# Patient Record
Sex: Male | Born: 1965 | Race: White | Hispanic: No | Marital: Married | Smoking: Never smoker
Health system: Southern US, Community
[De-identification: ages and names within clinical notes are randomized; demographics above are authoritative.]

## PROBLEM LIST (undated history)

## (undated) DIAGNOSIS — R51 Headache: Secondary | ICD-10-CM

## (undated) DIAGNOSIS — K219 Gastro-esophageal reflux disease without esophagitis: Secondary | ICD-10-CM

## (undated) DIAGNOSIS — M1990 Unspecified osteoarthritis, unspecified site: Secondary | ICD-10-CM

## (undated) DIAGNOSIS — G4730 Sleep apnea, unspecified: Secondary | ICD-10-CM

## (undated) DIAGNOSIS — R0602 Shortness of breath: Secondary | ICD-10-CM

## (undated) DIAGNOSIS — I10 Essential (primary) hypertension: Secondary | ICD-10-CM

## (undated) DIAGNOSIS — Z87442 Personal history of urinary calculi: Secondary | ICD-10-CM

## (undated) DIAGNOSIS — J189 Pneumonia, unspecified organism: Secondary | ICD-10-CM

## (undated) DIAGNOSIS — Z8489 Family history of other specified conditions: Secondary | ICD-10-CM

## (undated) DIAGNOSIS — E119 Type 2 diabetes mellitus without complications: Secondary | ICD-10-CM

## (undated) HISTORY — PX: PATELLAR TENDON REPAIR: SHX737

## (undated) HISTORY — PX: CARPAL TUNNEL RELEASE: SHX101

## (undated) HISTORY — PX: CORNEA LACERATION REPAIR: SHX355

## (undated) HISTORY — PX: HERNIA REPAIR: SHX51

## (undated) HISTORY — PX: TONSILLECTOMY: SUR1361

## (undated) HISTORY — PX: COLONOSCOPY WITH ESOPHAGOGASTRODUODENOSCOPY (EGD): SHX5779

## (undated) HISTORY — PX: OTHER SURGICAL HISTORY: SHX169

---

## 2014-01-15 ENCOUNTER — Other Ambulatory Visit: Payer: Self-pay | Admitting: Physical Medicine and Rehabilitation

## 2014-01-15 DIAGNOSIS — M5136 Other intervertebral disc degeneration, lumbar region: Secondary | ICD-10-CM

## 2014-01-22 ENCOUNTER — Ambulatory Visit
Admission: RE | Admit: 2014-01-22 | Discharge: 2014-01-22 | Disposition: A | Discharge status: Home / Self Care | Payer: Managed Care, Other (non HMO) | Source: Ambulatory Visit | Attending: Physical Medicine and Rehabilitation | Admitting: Physical Medicine and Rehabilitation

## 2014-01-22 DIAGNOSIS — M5136 Other intervertebral disc degeneration, lumbar region: Secondary | ICD-10-CM

## 2014-04-20 ENCOUNTER — Other Ambulatory Visit: Payer: Self-pay

## 2014-05-11 ENCOUNTER — Encounter (HOSPITAL_COMMUNITY): Payer: Self-pay | Admitting: Pharmacy Technician

## 2014-05-12 NOTE — Pre-Procedure Instructions (Signed)
Cory Holland  05/12/2014   Your procedure is scheduled on:  Wed, June 3 @ 8:30 AM  Report to [facility]  at 6:30 AM.  Call this number if you have problems the morning of surgery: [phone]   Remember:   Do not eat food or drink liquids after midnight.   Take these medicines the morning of surgery with A SIP OF WATER: Omeprazole(Prilosec) and Zoloft(Sertraline)              Stop taking your Ibuprofen,Meloxicam,and Naproxen. No Goody's,BC's,Aspirin,Fish Oil,or any Herbal Medications   Do not wear jewelry  Do not wear lotions, powders, or colognes. You may wear deodorant.  Men may shave face and neck.  Do not bring valuables to the hospital.  [facility] is not responsible                  for any belongings or valuables.               Contacts, dentures or bridgework may not be worn into surgery.  Leave suitcase in the car. After surgery it may be brought to your room.  For patients admitted to the hospital, discharge time is determined by your                treatment team.                   Special Instructions:  [name] - Preparing for Surgery  Before surgery, you can play an important role.  Because skin is not sterile, your skin needs to be as free of germs as possible.  You can reduce the number of germs on you skin by washing with CHG (chlorahexidine gluconate) soap before surgery.  CHG is an antiseptic cleaner which kills germs and bonds with the skin to continue killing germs even after washing.  Please DO NOT use if you have an allergy to CHG or antibacterial soaps.  If your skin becomes reddened/irritated stop using the CHG and inform your nurse when you arrive at Short Stay.  Do not shave (including legs and underarms) for at least 48 hours prior to the first CHG shower.  You may shave your face.  Please follow these instructions carefully:   1.  Shower with CHG Soap the night before surgery and the                                morning of  Surgery.  2.  If you choose to wash your hair, wash your hair first as usual with your       normal shampoo.  3.  After you shampoo, rinse your hair and body thoroughly to remove the                      Shampoo.  4.  Use CHG as you would any other liquid soap.  You can apply chg directly       to the skin and wash gently with scrungie or a clean washcloth.  5.  Apply the CHG Soap to your body ONLY FROM THE NECK DOWN.        Do not use on open wounds or open sores.  Avoid contact with your eyes,       ears, mouth and genitals (private parts).  Wash genitals (private parts)       with your normal soap.  6.  Wash thoroughly, paying special attention to the area where your surgery        will be performed.  7.  Thoroughly rinse your body with warm water from the neck down.  8.  DO NOT shower/wash with your normal soap after using and rinsing off       the CHG Soap.  9.  Pat yourself dry with a clean towel.            10.  Wear clean pajamas.            11.  Place clean sheets on your bed the night of your first shower and do not        sleep with pets.  Day of Surgery  Do not apply any lotions/deoderants the morning of surgery.  Please wear clean clothes to the hospital/surgery center.     Please read over the following fact sheets that you were given: Pain Booklet, Coughing and Deep Breathing, Blood Transfusion Information, MRSA Information and Surgical Site Infection Prevention

## 2014-05-13 ENCOUNTER — Encounter (HOSPITAL_COMMUNITY)
Admission: RE | Admit: 2014-05-13 | Discharge: 2014-05-13 | Disposition: A | Discharge status: Home / Self Care | Payer: Managed Care, Other (non HMO) | Source: Ambulatory Visit | Attending: Orthopedic Surgery | Admitting: Orthopedic Surgery

## 2014-05-13 ENCOUNTER — Encounter (HOSPITAL_COMMUNITY): Payer: Self-pay

## 2014-05-13 DIAGNOSIS — M5137 Other intervertebral disc degeneration, lumbosacral region: Secondary | ICD-10-CM | POA: Insufficient documentation

## 2014-05-13 DIAGNOSIS — Z01818 Encounter for other preprocedural examination: Secondary | ICD-10-CM | POA: Insufficient documentation

## 2014-05-13 DIAGNOSIS — M51379 Other intervertebral disc degeneration, lumbosacral region without mention of lumbar back pain or lower extremity pain: Secondary | ICD-10-CM | POA: Insufficient documentation

## 2014-05-13 DIAGNOSIS — Z01812 Encounter for preprocedural laboratory examination: Secondary | ICD-10-CM | POA: Insufficient documentation

## 2014-05-13 HISTORY — DX: Essential (primary) hypertension: I10

## 2014-05-13 HISTORY — DX: Shortness of breath: R06.02

## 2014-05-13 HISTORY — DX: Gastro-esophageal reflux disease without esophagitis: K21.9

## 2014-05-13 HISTORY — DX: Headache: R51

## 2014-05-13 LAB — CBC
HCT: 39 % (ref 39.0–52.0)
Hemoglobin: 13.4 g/dL (ref 13.0–17.0)
MCH: 29.8 pg (ref 26.0–34.0)
MCHC: 34.4 g/dL (ref 30.0–36.0)
MCV: 86.7 fL (ref 78.0–100.0)
PLATELETS: 194 10*3/uL (ref 150–400)
RBC: 4.5 MIL/uL (ref 4.22–5.81)
RDW: 13.8 % (ref 11.5–15.5)
WBC: 8.1 10*3/uL (ref 4.0–10.5)

## 2014-05-13 LAB — BASIC METABOLIC PANEL
BUN: 27 mg/dL — ABNORMAL HIGH (ref 6–23)
CHLORIDE: 100 meq/L (ref 96–112)
CO2: 24 mEq/L (ref 19–32)
Calcium: 9.9 mg/dL (ref 8.4–10.5)
Creatinine, Ser: 1.22 mg/dL (ref 0.50–1.35)
GFR calc Af Amer: 81 mL/min — ABNORMAL LOW (ref >90)
GFR calc non Af Amer: 70 mL/min — ABNORMAL LOW (ref >90)
GLUCOSE: 124 mg/dL — AB (ref 70–99)
POTASSIUM: 4 meq/L (ref 3.7–5.3)
Sodium: 138 mEq/L (ref 137–147)

## 2014-05-13 LAB — TYPE AND SCREEN
ABO/RH(D): A POS
ANTIBODY SCREEN: NEGATIVE

## 2014-05-13 LAB — ABO/RH: ABO/RH(D): A POS

## 2014-05-13 LAB — SURGICAL PCR SCREEN
MRSA, PCR: NEGATIVE
Staphylococcus aureus: NEGATIVE

## 2014-05-13 NOTE — Progress Notes (Signed)
Requested EKG and CXR from [facility].

## 2014-05-18 MED ORDER — DEXAMETHASONE SODIUM PHOSPHATE 4 MG/ML IJ SOLN
8.0000 mg | Freq: Once | INTRAMUSCULAR | Status: AC
  Administered 2014-05-19: 10 mg via INTRAVENOUS
  Filled 2014-05-18: qty 2

## 2014-05-18 MED ORDER — CHLORHEXIDINE GLUCONATE 4 % EX LIQD
60.0000 mL | Freq: Once | CUTANEOUS | Status: DC
  Filled 2014-05-18: qty 60

## 2014-05-18 MED ORDER — ACETAMINOPHEN 10 MG/ML IV SOLN
1000.0000 mg | Freq: Four times a day (QID) | INTRAVENOUS | Status: DC
  Administered 2014-05-19: 1000 mg via INTRAVENOUS

## 2014-05-18 MED ORDER — CEFAZOLIN SODIUM-DEXTROSE 2-3 GM-% IV SOLR
2.0000 g | INTRAVENOUS | Status: AC
  Administered 2014-05-19: 2 g via INTRAVENOUS
  Filled 2014-05-18: qty 50

## 2014-05-19 ENCOUNTER — Encounter (HOSPITAL_COMMUNITY)
Admission: RE | Disposition: A | Discharge status: Home / Self Care | Payer: Self-pay | Source: Ambulatory Visit | Attending: Orthopedic Surgery

## 2014-05-19 ENCOUNTER — Ambulatory Visit (HOSPITAL_COMMUNITY): Payer: Managed Care, Other (non HMO) | Admitting: Anesthesiology

## 2014-05-19 ENCOUNTER — Inpatient Hospital Stay (HOSPITAL_COMMUNITY)
Admission: RE | Admit: 2014-05-19 | Discharge: 2014-05-21 | DRG: 518 | Disposition: A | Discharge status: Home / Self Care | Payer: Managed Care, Other (non HMO) | Source: Ambulatory Visit | Attending: Orthopedic Surgery | Admitting: Orthopedic Surgery

## 2014-05-19 ENCOUNTER — Ambulatory Visit (HOSPITAL_COMMUNITY): Payer: Managed Care, Other (non HMO)

## 2014-05-19 ENCOUNTER — Encounter (HOSPITAL_COMMUNITY): Payer: Self-pay | Admitting: Surgery

## 2014-05-19 ENCOUNTER — Encounter (HOSPITAL_COMMUNITY): Payer: Managed Care, Other (non HMO) | Admitting: Anesthesiology

## 2014-05-19 DIAGNOSIS — M51379 Other intervertebral disc degeneration, lumbosacral region without mention of lumbar back pain or lower extremity pain: Principal | ICD-10-CM | POA: Diagnosis present

## 2014-05-19 DIAGNOSIS — I10 Essential (primary) hypertension: Secondary | ICD-10-CM | POA: Diagnosis present

## 2014-05-19 DIAGNOSIS — M5137 Other intervertebral disc degeneration, lumbosacral region: Secondary | ICD-10-CM

## 2014-05-19 DIAGNOSIS — Z981 Arthrodesis status: Secondary | ICD-10-CM

## 2014-05-19 DIAGNOSIS — K219 Gastro-esophageal reflux disease without esophagitis: Secondary | ICD-10-CM | POA: Diagnosis present

## 2014-05-19 DIAGNOSIS — Z79899 Other long term (current) drug therapy: Secondary | ICD-10-CM

## 2014-05-19 DIAGNOSIS — M549 Dorsalgia, unspecified: Secondary | ICD-10-CM | POA: Diagnosis present

## 2014-05-19 HISTORY — PX: ABDOMINAL EXPOSURE: SHX5708

## 2014-05-19 HISTORY — PX: LUMBAR DISC ARTHROPLASTY: SHX699

## 2014-05-19 SURGERY — LUMBAR ANTERIOR DISC ARTHROPLASTY
Anesthesia: General | Site: Spine Lumbar

## 2014-05-19 MED ORDER — PROPOFOL 10 MG/ML IV BOLUS
INTRAVENOUS | Status: AC
  Filled 2014-05-19: qty 20

## 2014-05-19 MED ORDER — LIDOCAINE HCL (CARDIAC) 20 MG/ML IV SOLN
INTRAVENOUS | Status: AC
  Filled 2014-05-19: qty 5

## 2014-05-19 MED ORDER — LACTATED RINGERS IV SOLN
INTRAVENOUS | Status: DC | PRN
  Administered 2014-05-19: 08:00:00 via INTRAVENOUS

## 2014-05-19 MED ORDER — VECURONIUM BROMIDE 10 MG IV SOLR
INTRAVENOUS | Status: AC
  Filled 2014-05-19: qty 10

## 2014-05-19 MED ORDER — FENTANYL CITRATE 0.05 MG/ML IJ SOLN
INTRAMUSCULAR | Status: AC
  Filled 2014-05-19: qty 5

## 2014-05-19 MED ORDER — FLEET ENEMA 7-19 GM/118ML RE ENEM: 1.0000 | ENEMA | Freq: Once | RECTAL | Status: AC | PRN

## 2014-05-19 MED ORDER — HEMOSTATIC AGENTS (NO CHARGE) OPTIME
TOPICAL | Status: DC | PRN
  Administered 2014-05-19: 1 via TOPICAL

## 2014-05-19 MED ORDER — MIDAZOLAM HCL 2 MG/2ML IJ SOLN
INTRAMUSCULAR | Status: AC
  Filled 2014-05-19: qty 2

## 2014-05-19 MED ORDER — NEOSTIGMINE METHYLSULFATE 10 MG/10ML IV SOLN
INTRAVENOUS | Status: DC | PRN
  Administered 2014-05-19: 4 mg via INTRAVENOUS

## 2014-05-19 MED ORDER — PHENOL 1.4 % MT LIQD
1.0000 | OROMUCOSAL | Status: DC | PRN
Start: 2014-05-19 — End: 2014-05-21

## 2014-05-19 MED ORDER — ONDANSETRON HCL 4 MG/2ML IJ SOLN
INTRAMUSCULAR | Status: DC | PRN
  Administered 2014-05-19 (×2): 4 mg via INTRAVENOUS

## 2014-05-19 MED ORDER — SERTRALINE HCL 100 MG PO TABS
100.0000 mg | ORAL_TABLET | Freq: Every day | ORAL | Status: DC
  Administered 2014-05-19 – 2014-05-20 (×2): 100 mg via ORAL
  Filled 2014-05-19 (×3): qty 1

## 2014-05-19 MED ORDER — ATORVASTATIN CALCIUM 40 MG PO TABS
40.0000 mg | ORAL_TABLET | Freq: Every day | ORAL | Status: DC
  Administered 2014-05-19 – 2014-05-20 (×2): 40 mg via ORAL
  Filled 2014-05-19 (×3): qty 1

## 2014-05-19 MED ORDER — ROCURONIUM BROMIDE 50 MG/5ML IV SOLN
INTRAVENOUS | Status: AC
  Filled 2014-05-19: qty 1

## 2014-05-19 MED ORDER — LACTATED RINGERS IV SOLN
INTRAVENOUS | Status: DC | PRN
  Administered 2014-05-19 (×2): via INTRAVENOUS

## 2014-05-19 MED ORDER — DIPHENHYDRAMINE HCL 50 MG/ML IJ SOLN: 12.5000 mg | Freq: Four times a day (QID) | INTRAMUSCULAR | Status: DC | PRN

## 2014-05-19 MED ORDER — OXYCODONE HCL 5 MG PO TABS
10.0000 mg | ORAL_TABLET | ORAL | Status: DC | PRN
  Administered 2014-05-20 – 2014-05-21 (×5): 10 mg via ORAL
  Filled 2014-05-19 (×5): qty 2

## 2014-05-19 MED ORDER — SODIUM CHLORIDE 0.9 % IV SOLN: 250.0000 mL | INTRAVENOUS | Status: DC

## 2014-05-19 MED ORDER — EPHEDRINE SULFATE 50 MG/ML IJ SOLN
INTRAMUSCULAR | Status: AC
  Filled 2014-05-19: qty 1

## 2014-05-19 MED ORDER — OXYCODONE HCL 5 MG/5ML PO SOLN: 5.0000 mg | Freq: Once | ORAL | Status: DC | PRN

## 2014-05-19 MED ORDER — GLYCOPYRROLATE 0.2 MG/ML IJ SOLN
INTRAMUSCULAR | Status: DC | PRN
  Administered 2014-05-19: 0.6 mg via INTRAVENOUS
  Administered 2014-05-19: 0.4 mg via INTRAVENOUS

## 2014-05-19 MED ORDER — ONDANSETRON HCL 4 MG/2ML IJ SOLN: 4.0000 mg | Freq: Four times a day (QID) | INTRAMUSCULAR | Status: DC | PRN

## 2014-05-19 MED ORDER — PROPOFOL 10 MG/ML IV BOLUS
INTRAVENOUS | Status: DC | PRN
  Administered 2014-05-19: 200 mg via INTRAVENOUS

## 2014-05-19 MED ORDER — LACTATED RINGERS IV SOLN
INTRAVENOUS | Status: DC
  Administered 2014-05-19 – 2014-05-20 (×2): via INTRAVENOUS

## 2014-05-19 MED ORDER — ACETAMINOPHEN 10 MG/ML IV SOLN
INTRAVENOUS | Status: AC
  Filled 2014-05-19: qty 100

## 2014-05-19 MED ORDER — DIPHENHYDRAMINE HCL 12.5 MG/5ML PO ELIX: 12.5000 mg | ORAL_SOLUTION | Freq: Four times a day (QID) | ORAL | Status: DC | PRN

## 2014-05-19 MED ORDER — SODIUM CHLORIDE 0.9 % IJ SOLN: 9.0000 mL | INTRAMUSCULAR | Status: DC | PRN

## 2014-05-19 MED ORDER — ONDANSETRON HCL 4 MG/2ML IJ SOLN
INTRAMUSCULAR | Status: AC
  Filled 2014-05-19: qty 2

## 2014-05-19 MED ORDER — DEXAMETHASONE SODIUM PHOSPHATE 10 MG/ML IJ SOLN: INTRAMUSCULAR | Status: DC | PRN

## 2014-05-19 MED ORDER — VECURONIUM BROMIDE 10 MG IV SOLR
INTRAVENOUS | Status: DC | PRN
  Administered 2014-05-19 (×2): 2 mg via INTRAVENOUS
  Administered 2014-05-19 (×2): 3 mg via INTRAVENOUS

## 2014-05-19 MED ORDER — ROCURONIUM BROMIDE 100 MG/10ML IV SOLN
INTRAVENOUS | Status: DC | PRN
  Administered 2014-05-19: 50 mg via INTRAVENOUS
  Administered 2014-05-19: 10 mg via INTRAVENOUS
  Administered 2014-05-19 (×2): 20 mg via INTRAVENOUS

## 2014-05-19 MED ORDER — DEXAMETHASONE SODIUM PHOSPHATE 4 MG/ML IJ SOLN
4.0000 mg | Freq: Four times a day (QID) | INTRAMUSCULAR | Status: DC
  Administered 2014-05-19 – 2014-05-21 (×4): 4 mg via INTRAVENOUS
  Filled 2014-05-19 (×12): qty 1

## 2014-05-19 MED ORDER — EPHEDRINE SULFATE 50 MG/ML IJ SOLN
INTRAMUSCULAR | Status: DC | PRN
  Administered 2014-05-19: 5 mg via INTRAVENOUS
  Administered 2014-05-19: 10 mg via INTRAVENOUS

## 2014-05-19 MED ORDER — BUPIVACAINE-EPINEPHRINE (PF) 0.25% -1:200000 IJ SOLN
INTRAMUSCULAR | Status: AC
  Filled 2014-05-19: qty 30

## 2014-05-19 MED ORDER — PHENYLEPHRINE HCL 10 MG/ML IJ SOLN
INTRAMUSCULAR | Status: DC | PRN
  Administered 2014-05-19: 120 ug via INTRAVENOUS
  Administered 2014-05-19: 80 ug via INTRAVENOUS
  Administered 2014-05-19: 120 ug via INTRAVENOUS
  Administered 2014-05-19: 80 ug via INTRAVENOUS

## 2014-05-19 MED ORDER — HYDROMORPHONE HCL PF 1 MG/ML IJ SOLN
INTRAMUSCULAR | Status: AC
  Filled 2014-05-19: qty 2

## 2014-05-19 MED ORDER — SODIUM CHLORIDE 0.9 % IJ SOLN: 3.0000 mL | INTRAMUSCULAR | Status: DC | PRN

## 2014-05-19 MED ORDER — THROMBIN 20000 UNITS EX SOLR
CUTANEOUS | Status: AC
  Filled 2014-05-19: qty 20000

## 2014-05-19 MED ORDER — ARTIFICIAL TEARS OP OINT
TOPICAL_OINTMENT | OPHTHALMIC | Status: AC
  Filled 2014-05-19: qty 3.5

## 2014-05-19 MED ORDER — HYDROMORPHONE HCL PF 1 MG/ML IJ SOLN: 0.2500 mg | INTRAMUSCULAR | Status: DC | PRN

## 2014-05-19 MED ORDER — BUPIVACAINE-EPINEPHRINE 0.25% -1:200000 IJ SOLN
INTRAMUSCULAR | Status: DC | PRN
  Administered 2014-05-19: 30 mL

## 2014-05-19 MED ORDER — LIDOCAINE HCL (CARDIAC) 20 MG/ML IV SOLN
INTRAVENOUS | Status: DC | PRN
  Administered 2014-05-19: 80 mg via INTRAVENOUS

## 2014-05-19 MED ORDER — DEXAMETHASONE SODIUM PHOSPHATE 10 MG/ML IJ SOLN
INTRAMUSCULAR | Status: AC
  Filled 2014-05-19: qty 1

## 2014-05-19 MED ORDER — STERILE WATER FOR INJECTION IJ SOLN
INTRAMUSCULAR | Status: AC
  Filled 2014-05-19: qty 10

## 2014-05-19 MED ORDER — GLYCOPYRROLATE 0.2 MG/ML IJ SOLN
INTRAMUSCULAR | Status: AC
  Filled 2014-05-19: qty 2

## 2014-05-19 MED ORDER — MORPHINE SULFATE (PF) 1 MG/ML IV SOLN
INTRAVENOUS | Status: AC
  Filled 2014-05-19: qty 25

## 2014-05-19 MED ORDER — SERTRALINE HCL 100 MG PO TABS
100.0000 mg | ORAL_TABLET | Freq: Every day | ORAL | Status: DC
  Filled 2014-05-19: qty 1

## 2014-05-19 MED ORDER — SODIUM CHLORIDE 0.9 % IJ SOLN
3.0000 mL | Freq: Two times a day (BID) | INTRAMUSCULAR | Status: DC
  Administered 2014-05-19 – 2014-05-20 (×2): 3 mL via INTRAVENOUS

## 2014-05-19 MED ORDER — PROMETHAZINE HCL 25 MG/ML IJ SOLN
6.2500 mg | INTRAMUSCULAR | Status: DC | PRN
Start: 2014-05-19 — End: 2014-05-19

## 2014-05-19 MED ORDER — LOSARTAN POTASSIUM 50 MG PO TABS
50.0000 mg | ORAL_TABLET | Freq: Every day | ORAL | Status: DC
  Administered 2014-05-21: 50 mg via ORAL
  Filled 2014-05-19 (×3): qty 1

## 2014-05-19 MED ORDER — DOCUSATE SODIUM 100 MG PO CAPS
100.0000 mg | ORAL_CAPSULE | Freq: Two times a day (BID) | ORAL | Status: DC
  Administered 2014-05-19 – 2014-05-21 (×4): 100 mg via ORAL
  Filled 2014-05-19 (×4): qty 1

## 2014-05-19 MED ORDER — THROMBIN 20000 UNITS EX SOLR
CUTANEOUS | Status: DC | PRN
Start: 2014-05-19 — End: 2014-05-19
  Administered 2014-05-19: 10:00:00 via TOPICAL

## 2014-05-19 MED ORDER — SODIUM CHLORIDE 0.9 % IV SOLN
10.0000 mg | INTRAVENOUS | Status: DC | PRN
  Administered 2014-05-19: 10 ug/min via INTRAVENOUS

## 2014-05-19 MED ORDER — 0.9 % SODIUM CHLORIDE (POUR BTL) OPTIME
TOPICAL | Status: DC | PRN
  Administered 2014-05-19 (×2): 1000 mL

## 2014-05-19 MED ORDER — ALBUMIN HUMAN 5 % IV SOLN
INTRAVENOUS | Status: DC | PRN
  Administered 2014-05-19: 11:00:00 via INTRAVENOUS

## 2014-05-19 MED ORDER — MORPHINE SULFATE (PF) 1 MG/ML IV SOLN
INTRAVENOUS | Status: DC
  Administered 2014-05-19: 11 mg via INTRAVENOUS
  Administered 2014-05-19: 4 mg via INTRAVENOUS
  Administered 2014-05-19 – 2014-05-20 (×2): via INTRAVENOUS
  Administered 2014-05-20 (×2): 6 mg via INTRAVENOUS
  Filled 2014-05-19: qty 25

## 2014-05-19 MED ORDER — ACETAMINOPHEN 10 MG/ML IV SOLN
1000.0000 mg | Freq: Four times a day (QID) | INTRAVENOUS | Status: AC
  Administered 2014-05-19 – 2014-05-20 (×3): 1000 mg via INTRAVENOUS
  Filled 2014-05-19 (×4): qty 100

## 2014-05-19 MED ORDER — GLYCOPYRROLATE 0.2 MG/ML IJ SOLN
INTRAMUSCULAR | Status: AC
  Filled 2014-05-19: qty 4

## 2014-05-19 MED ORDER — NALOXONE HCL 0.4 MG/ML IJ SOLN: 0.4000 mg | INTRAMUSCULAR | Status: DC | PRN

## 2014-05-19 MED ORDER — NEOSTIGMINE METHYLSULFATE 10 MG/10ML IV SOLN
INTRAVENOUS | Status: AC
  Filled 2014-05-19: qty 1

## 2014-05-19 MED ORDER — METHOCARBAMOL 500 MG PO TABS
500.0000 mg | ORAL_TABLET | Freq: Four times a day (QID) | ORAL | Status: DC | PRN
  Administered 2014-05-20 (×2): 500 mg via ORAL
  Filled 2014-05-19 (×2): qty 1

## 2014-05-19 MED ORDER — METHOCARBAMOL 1000 MG/10ML IJ SOLN
500.0000 mg | Freq: Four times a day (QID) | INTRAVENOUS | Status: DC | PRN
  Filled 2014-05-19: qty 5

## 2014-05-19 MED ORDER — MENTHOL 3 MG MT LOZG: 1.0000 | LOZENGE | OROMUCOSAL | Status: DC | PRN

## 2014-05-19 MED ORDER — CEFAZOLIN SODIUM 1-5 GM-% IV SOLN
1.0000 g | Freq: Three times a day (TID) | INTRAVENOUS | Status: AC
  Administered 2014-05-19 – 2014-05-20 (×2): 1 g via INTRAVENOUS
  Filled 2014-05-19 (×3): qty 50

## 2014-05-19 MED ORDER — OXYCODONE HCL 5 MG PO TABS: 5.0000 mg | ORAL_TABLET | Freq: Once | ORAL | Status: DC | PRN

## 2014-05-19 MED ORDER — DEXAMETHASONE 4 MG PO TABS
4.0000 mg | ORAL_TABLET | Freq: Four times a day (QID) | ORAL | Status: DC
  Administered 2014-05-20 – 2014-05-21 (×5): 4 mg via ORAL
  Filled 2014-05-19 (×12): qty 1

## 2014-05-19 MED ORDER — FENTANYL CITRATE 0.05 MG/ML IJ SOLN
INTRAMUSCULAR | Status: DC | PRN
  Administered 2014-05-19 (×2): 50 ug via INTRAVENOUS
  Administered 2014-05-19: 150 ug via INTRAVENOUS

## 2014-05-19 MED ORDER — ONDANSETRON HCL 4 MG/2ML IJ SOLN: 4.0000 mg | INTRAMUSCULAR | Status: DC | PRN

## 2014-05-19 MED ORDER — PNEUMOCOCCAL VAC POLYVALENT 25 MCG/0.5ML IJ INJ
0.5000 mL | INJECTION | INTRAMUSCULAR | Status: AC
  Administered 2014-05-21: 0.5 mL via INTRAMUSCULAR
  Filled 2014-05-19 (×2): qty 0.5

## 2014-05-19 SURGICAL SUPPLY — 90 items
APPLIER CLIP 11 MED OPEN (CLIP) ×3
BLADE 10 SAFETY STRL DISP (BLADE) IMPLANT
BLADE SURG 10 STRL SS (BLADE) ×3 IMPLANT
CLIP APPLIE 11 MED OPEN (CLIP) ×2 IMPLANT
CLIP LIGATING EXTRA MED SLVR (CLIP) IMPLANT
CLIP LIGATING EXTRA SM BLUE (MISCELLANEOUS) IMPLANT
CLSR STERI-STRIP ANTIMIC 1/2X4 (GAUZE/BANDAGES/DRESSINGS) ×3 IMPLANT
CORDS BIPOLAR (ELECTRODE) ×3 IMPLANT
COVER SURGICAL LIGHT HANDLE (MISCELLANEOUS) ×9 IMPLANT
DERMABOND ADVANCED (GAUZE/BANDAGES/DRESSINGS) ×2
DERMABOND ADVANCED .7 DNX12 (GAUZE/BANDAGES/DRESSINGS) ×4 IMPLANT
DRAPE C-ARM 42X72 X-RAY (DRAPES) ×6 IMPLANT
DRAPE C-ARMOR (DRAPES) ×3 IMPLANT
DRAPE INCISE IOBAN 66X45 STRL (DRAPES) ×3 IMPLANT
DRAPE POUCH INSTRU U-SHP 10X18 (DRAPES) ×3 IMPLANT
DRAPE PROXIMA HALF (DRAPES) ×9 IMPLANT
DRAPE SURG 17X23 STRL (DRAPES) ×12 IMPLANT
DRAPE U-SHAPE 47X51 STRL (DRAPES) ×3 IMPLANT
DRSG MEPILEX BORDER 4X8 (GAUZE/BANDAGES/DRESSINGS) ×3 IMPLANT
DURAPREP 26ML APPLICATOR (WOUND CARE) ×3 IMPLANT
ELECT BLADE 4.0 EZ CLEAN MEGAD (MISCELLANEOUS) ×6
ELECT CAUTERY BLADE 6.4 (BLADE) IMPLANT
ELECT PENCIL ROCKER SW 15FT (MISCELLANEOUS) ×3 IMPLANT
ELECT REM PT RETURN 9FT ADLT (ELECTROSURGICAL) ×3
ELECTRODE BLDE 4.0 EZ CLN MEGD (MISCELLANEOUS) ×4 IMPLANT
ELECTRODE REM PT RTRN 9FT ADLT (ELECTROSURGICAL) ×2 IMPLANT
GAUZE SPONGE 4X4 16PLY XRAY LF (GAUZE/BANDAGES/DRESSINGS) ×6 IMPLANT
GLOVE BIOGEL PI IND STRL 7.0 (GLOVE) ×2 IMPLANT
GLOVE BIOGEL PI IND STRL 8 (GLOVE) ×2 IMPLANT
GLOVE BIOGEL PI IND STRL 8.5 (GLOVE) ×2 IMPLANT
GLOVE BIOGEL PI INDICATOR 7.0 (GLOVE) ×1
GLOVE BIOGEL PI INDICATOR 8 (GLOVE) ×1
GLOVE BIOGEL PI INDICATOR 8.5 (GLOVE) ×1
GLOVE ECLIPSE 8.5 STRL (GLOVE) ×3 IMPLANT
GLOVE ORTHO TXT STRL SZ7.5 (GLOVE) ×3 IMPLANT
GLOVE SS BIOGEL STRL SZ 7.5 (GLOVE) ×2 IMPLANT
GLOVE SUPERSENSE BIOGEL SZ 7.5 (GLOVE) ×1
GLOVE SURG SS PI 7.0 STRL IVOR (GLOVE) ×3 IMPLANT
GOWN STRL REUS W/ TWL LRG LVL3 (GOWN DISPOSABLE) ×8 IMPLANT
GOWN STRL REUS W/TWL 2XL LVL3 (GOWN DISPOSABLE) ×6 IMPLANT
GOWN STRL REUS W/TWL LRG LVL3 (GOWN DISPOSABLE) ×4
HEMOSTAT SNOW SURGICEL 2X4 (HEMOSTASIS) IMPLANT
INLAY POLYETHYLENE W/TANTALUM (Orthopedic Implant) ×3 IMPLANT
INSERT FOGARTY 61MM (MISCELLANEOUS) IMPLANT
INSERT FOGARTY SM (MISCELLANEOUS) IMPLANT
KIT BASIN OR (CUSTOM PROCEDURE TRAY) ×3 IMPLANT
KIT ROOM TURNOVER OR (KITS) ×6 IMPLANT
LOOP VESSEL MAXI BLUE (MISCELLANEOUS) IMPLANT
LOOP VESSEL MINI RED (MISCELLANEOUS) IMPLANT
NEEDLE 18GX1X1/2 (RX/OR ONLY) (NEEDLE) ×3 IMPLANT
NS IRRIG 1000ML POUR BTL (IV SOLUTION) ×6 IMPLANT
PACK LAMINECTOMY ORTHO (CUSTOM PROCEDURE TRAY) ×3 IMPLANT
PACK UNIVERSAL I (CUSTOM PROCEDURE TRAY) ×3 IMPLANT
PAD ARMBOARD 7.5X6 YLW CONV (MISCELLANEOUS) ×12 IMPLANT
PLATE END SUPERIOR (Plate) ×3 IMPLANT
PLATE INFERIOR END (Plate) ×3 IMPLANT
SPONGE INTESTINAL PEANUT (DISPOSABLE) ×12 IMPLANT
SPONGE LAP 18X18 X RAY DECT (DISPOSABLE) ×3 IMPLANT
SPONGE LAP 4X18 X RAY DECT (DISPOSABLE) ×3 IMPLANT
SPONGE SURGIFOAM ABS GEL 100 (HEMOSTASIS) ×3 IMPLANT
STAPLER VISISTAT 35W (STAPLE) ×3 IMPLANT
STRIP CLOSURE SKIN 1/2X4 (GAUZE/BANDAGES/DRESSINGS) ×3 IMPLANT
SURGIFLO TRUKIT (HEMOSTASIS) ×3 IMPLANT
SUT MNCRL AB 4-0 PS2 18 (SUTURE) ×3 IMPLANT
SUT MON AB 3-0 SH 27 (SUTURE) ×1
SUT MON AB 3-0 SH27 (SUTURE) ×2 IMPLANT
SUT PDS AB 1 CTX 36 (SUTURE) ×3 IMPLANT
SUT PROLENE 4 0 RB 1 (SUTURE)
SUT PROLENE 4-0 RB1 .5 CRCL 36 (SUTURE) IMPLANT
SUT PROLENE 5 0 CC1 (SUTURE) IMPLANT
SUT PROLENE 6 0 C 1 30 (SUTURE) IMPLANT
SUT PROLENE 6 0 CC (SUTURE) IMPLANT
SUT SILK 0 TIES 10X30 (SUTURE) ×3 IMPLANT
SUT SILK 2 0 TIES 10X30 (SUTURE) ×6 IMPLANT
SUT SILK 2 0SH CR/8 30 (SUTURE) IMPLANT
SUT SILK 3 0 TIES 10X30 (SUTURE) ×3 IMPLANT
SUT SILK 3 0SH CR/8 30 (SUTURE) IMPLANT
SUT VIC AB 0 CT1 27 (SUTURE) ×3
SUT VIC AB 0 CT1 27XBRD ANBCTR (SUTURE) ×6 IMPLANT
SUT VIC AB 2-0 CT1 18 (SUTURE) ×3 IMPLANT
SUT VIC AB 2-0 CT1 36 (SUTURE) ×3 IMPLANT
SUT VIC AB 3-0 SH 27 (SUTURE) ×1
SUT VIC AB 3-0 SH 27X BRD (SUTURE) ×2 IMPLANT
SUT VIC AB 3-0 SH 27XBRD (SUTURE) IMPLANT
SYR BULB IRRIGATION 50ML (SYRINGE) ×3 IMPLANT
SYR CONTROL 10ML LL (SYRINGE) ×3 IMPLANT
TOWEL OR 17X24 6PK STRL BLUE (TOWEL DISPOSABLE) ×6 IMPLANT
TOWEL OR 17X26 10 PK STRL BLUE (TOWEL DISPOSABLE) ×6 IMPLANT
TRAY FOLEY CATH 14FRSI W/METER (CATHETERS) ×3 IMPLANT
WATER STERILE IRR 1000ML POUR (IV SOLUTION) IMPLANT

## 2014-05-19 NOTE — Anesthesia Procedure Notes (Addendum)
Procedure Name: Intubation Date/Time: 05/19/2014 8:40 AM Performed by: Sharlene Dory E Pre-anesthesia Checklist: Patient identified, Emergency Drugs available, Suction available, Patient being monitored and Timeout performed Patient Re-evaluated:Patient Re-evaluated prior to inductionOxygen Delivery Method: Circle system utilized Preoxygenation: Pre-oxygenation with 100% oxygen Intubation Type: IV induction Ventilation: Mask ventilation without difficulty and Oral airway inserted - appropriate to patient size Laryngoscope Size: Mac and 3 Grade View: Grade II Tube type: Oral Tube size: 7.5 mm Number of attempts: 1 Airway Equipment and Method: Stylet Placement Confirmation: ETT inserted through vocal cords under direct vision,  positive ETCO2 and breath sounds checked- equal and bilateral Secured at: 22 cm Tube secured with: Tape Dental Injury: Teeth and Oropharynx as per pre-operative assessment  Comments: AOI per Harlen [employer], SRNA.

## 2014-05-19 NOTE — Brief Op Note (Signed)
05/19/2014  12:08 PM  PATIENT:  Cory Holland  48 y.o. male  PRE-OPERATIVE DIAGNOSIS:  DEGENERATIVE DISC DISEASE  L5-S1   POST-OPERATIVE DIAGNOSIS:  DEGENERATIVE DISC DISEASE  L5-S1   PROCEDURE:  Procedure(s): TOTAL DISC REPLACEMENT L5-S1  (1 LEVEL) (N/A) ABDOMINAL EXPOSURE (N/A)  SURGEON:  Surgeon(s) and Role: Panel 1:    * Venita Lick, MD - Primary  Panel 2:    * Larina Earthly, MD - Primary  PHYSICIAN ASSISTANT:   ASSISTANTS: Zonia Kief  Vascular Surgeon: Dr Arbie Cookey   ANESTHESIA:   general  EBL:  Total I/O In: 2050 [I.V.:1800; IV Piggyback:250] Out: 725 [Urine:325; Blood:400]  BLOOD ADMINISTERED:none  DRAINS: none   LOCAL MEDICATIONS USED:  NONE  SPECIMEN:  No Specimen  DISPOSITION OF SPECIMEN:  N/A  COUNTS:  YES  TOURNIQUET:  * No tourniquets in log *  DICTATION: .Other Dictation: Dictation Number [id number]  PLAN OF CARE: Admit to inpatient   PATIENT DISPOSITION:  PACU - hemodynamically stable.

## 2014-05-19 NOTE — Anesthesia Preprocedure Evaluation (Addendum)
Anesthesia Evaluation  Patient identified by MRN, date of birth, ID band Patient awake    Reviewed: Allergy & Precautions, H&P , NPO status , Patient's Chart, lab work & pertinent test results  History of Anesthesia Complications Negative for: history of anesthetic complications  Airway Mallampati: II TM Distance: >3 FB Neck ROM: Full    Dental  (+) Teeth Intact, Dental Advisory Given   Pulmonary neg pulmonary ROS,    Pulmonary exam normal       Cardiovascular hypertension, Pt. on medications     Neuro/Psych negative neurological ROS  negative psych ROS   GI/Hepatic Neg liver ROS, GERD-  Medicated,  Endo/Other  negative endocrine ROS  Renal/GU negative Renal ROS     Musculoskeletal   Abdominal   Peds  Hematology   Anesthesia Other Findings   Reproductive/Obstetrics                          Anesthesia Physical Anesthesia Plan  ASA: II  Anesthesia Plan: General   Post-op Pain Management:    Induction: Intravenous  Airway Management Planned: Oral ETT  Additional Equipment:   Intra-op Plan:   Post-operative Plan: Extubation in OR  Informed Consent: I have reviewed the patients History and Physical, chart, labs and discussed the procedure including the risks, benefits and alternatives for the proposed anesthesia with the patient or authorized representative who has indicated his/her understanding and acceptance.   Dental advisory given  Plan Discussed with: CRNA, Anesthesiologist and Surgeon  Anesthesia Plan Comments:        Anesthesia Quick Evaluation

## 2014-05-19 NOTE — Anesthesia Postprocedure Evaluation (Signed)
Anesthesia Post Note  Patient: Cory Holland  Procedure(s) Performed: Procedure(s) (LRB): TOTAL DISC REPLACEMENT L5-S1  (1 LEVEL) (N/A) ABDOMINAL EXPOSURE (N/A)  Anesthesia type: general  Patient location: PACU  Post pain: Pain level controlled  Post assessment: Patient's Cardiovascular Status Stable  Last Vitals:  Filed Vitals:   05/19/14 1245  BP: 107/57  Pulse: 115  Temp:   Resp: 14    Post vital signs: Reviewed and stable  Level of consciousness: sedated  Complications: No apparent anesthesia complications

## 2014-05-19 NOTE — Progress Notes (Signed)
Utilization review completed.  

## 2014-05-19 NOTE — Transfer of Care (Signed)
Immediate Anesthesia Transfer of Care Note  Patient: Cory Holland  Procedure(s) Performed: Procedure(s): TOTAL DISC REPLACEMENT L5-S1  (1 LEVEL) (N/A) ABDOMINAL EXPOSURE (N/A)  Patient Location: PACU  Anesthesia Type:General  Level of Consciousness: sedated  Airway & Oxygen Therapy: Patient Spontanous Breathing and Patient connected to face mask oxygen  Post-op Assessment: Report given to PACU RN, Post -op Vital signs reviewed and stable and Patient moving all extremities X 4  Post vital signs: Reviewed and stable  Complications: No apparent anesthesia complications

## 2014-05-19 NOTE — Op Note (Signed)
    OPERATIVE REPORT  DATE OF SURGERY: 05/19/2014  PATIENT: Cory Holland, 48 y.o. male MRN: [id number]  DOB: [birth year]  PRE-OPERATIVE DIAGNOSIS: Degenerative disc disease  POST-OPERATIVE DIAGNOSIS:  Same  PROCEDURE: Anterior exposure for L5-S1 disc replacement  SURGEON:  Gretta Began, M.D.  Co-surgeon for the exposure Dr. Shon Baton  ANESTHESIA:  Gen.    Total I/O In: 2550 [I.V.:2300; IV Piggyback:250] Out: 800 [Urine:400; Blood:400]  BLOOD ADMINISTERED: None  DRAINS: None  SPECIMEN: L5-S1 disc  COUNTS CORRECT:  YES  PLAN OF CARE: Recovery room   PATIENT DISPOSITION:  PACU - hemodynamically stable  PROCEDURE DETAILS: Patient was taken up replacing that is where the area of the abdomen was prepped and draped in usual fashion. Lateral C-arm projection was used to identify the level of the L5-S1 disc. This was in the left lower quadrant. This was marked. Incision was made in the left lower quadrant carried down through the subcutaneous fat with electrocautery. That was mobilized off the anterior rectus sheath. The anterior rectus sheath was opened in line with the skin incision. The rectus muscle was mobilized circumferentially. The retroperitoneal space was entered bluntly below the level of the semilunar line and the peritoneal contents were mobilized to the right. The posterior rectus sheath was opened laterally. Mobilization continued to the level of L5-S1 disc. The peritoneum was not entered. The ureter was identified and preserved and mobilized to the right as well. The L5-S1 disc was exposed between the level of the common iliac arteries and veins. This was bluntly mobilized. The middle sacral vessels were occluded with ligaclips and divided. The mobilization continued to the right and left of the disc. The Thompson retractor was brought onto the field and the reverse lip 150 blades were positioned to the right and left of the L5-S1 disc and the malleable blades were placed  superiorly and inferiorly. C-arm was brought back onto the field to confirm this location. The remainder of the procedure including closure of the dictated as a separate note by Dr. Serina Cowper, M.D. 05/19/2014 1:32 PM

## 2014-05-19 NOTE — Progress Notes (Signed)
CSW to follow and assist with discharge planning as needed.  Sabino Niemann, MSW, [employer] [phone]

## 2014-05-19 NOTE — Evaluation (Signed)
Occupational Therapy Evaluation Patient Details Name: Cory Holland MRN: [id number] DOB: September 30, 1966 Today's Date: 05/19/2014    History of Present Illness 48 y.o. s/p TOTAL DISC REPLACEMENT L5-S1  (1 LEVEL)    Clinical Impression   Pt presents with below problem list. Pt independent with ADLs, PTA. Feel pt will benefit from acute OT to increase independence prior to d/c.    Follow Up Recommendations  No OT follow up;Supervision - Intermittent    Equipment Recommendations  3 in 1 bedside comode    Recommendations for Other Services       Precautions / Restrictions Precautions Precautions: Back Precaution Booklet Issued: Yes (comment) Precaution Comments: Reviewed precautions with pt/family Required Braces or Orthoses: Spinal Brace Spinal Brace: Lumbar corset;Applied in sitting position Restrictions Weight Bearing Restrictions: No      Mobility Bed Mobility Overal bed mobility: Needs Assistance Bed Mobility: Rolling;Sidelying to Sit Rolling: Min guard Sidelying to sit: Mod assist       General bed mobility comments: Assistance with trunk. Cues for log roll technique.  Transfers Overall transfer level: Needs assistance   Transfers: Sit to/from Stand;Stand Pivot Transfers Sit to Stand: Mod assist Stand pivot transfers: Min assist       General transfer comment: Cues for technique. Assistance to rise to standing position.    Balance                                            ADL Overall ADL's : Needs assistance/impaired     Grooming: Wash/dry face;Set up;Sitting;Supervision/safety           Upper Body Dressing : Minimal assistance;Sitting (back brace)   Lower Body Dressing: Maximal assistance;Sit to/from stand   Toilet Transfer: Minimal assistance;Ambulation;Stand-pivot;RW (from bed to chair)           Functional mobility during ADLs: Minimal assistance;Rolling walker General ADL Comments: Educated that AE is available for ADLs  and toilet aid if needed. Educated on use of two cups for teeth care and placement of grooming items to avoid breaking precautions. Educated on back brace. Educated on safe shoewear, discussed rugs, and recommended sitting for LB bathing/dressing.     Vision                     Perception     Praxis      Pertinent Vitals/Pain Pain 4/10. Repositioned.      Hand Dominance Right   Extremity/Trunk Assessment Upper Extremity Assessment Upper Extremity Assessment: Overall WFL for tasks assessed   Lower Extremity Assessment Lower Extremity Assessment: Defer to PT evaluation       Communication Communication Communication: No difficulties   Cognition Arousal/Alertness: Awake/alert Behavior During Therapy: WFL for tasks assessed/performed Overall Cognitive Status: Within Functional Limits for tasks assessed                     General Comments       Exercises       Shoulder Instructions      Home Living Family/patient expects to be discharged to:: Private residence Living Arrangements: Spouse/significant other Available Help at Discharge: Family;Available 24 hours/day Type of Home: House Home Access: Stairs to enter [employer] of Steps: 5 Entrance Stairs-Rails: Left       Bathroom Shower/Tub: Chief Strategy Officer: Standard     Home Equipment: Hand held shower head;Tub  bench;Walker - standard          Prior Functioning/Environment Level of Independence: Independent             OT Diagnosis: Acute pain   OT Problem List: Decreased strength;Decreased activity tolerance;Pain;Decreased knowledge of precautions;Decreased knowledge of use of DME or AE   OT Treatment/Interventions: Self-care/ADL training;DME and/or AE instruction;Therapeutic activities;Patient/family education;Balance training    OT Goals(Current goals can be found in the care plan section) Acute Rehab OT Goals Patient Stated Goal: not stated OT  Goal Formulation: With patient Time For Goal Achievement: 05/26/14 Potential to Achieve Goals: Good ADL Goals Pt Will Perform Grooming: with modified independence;standing Pt Will Perform Lower Body Bathing: with modified independence;sit to/from stand Pt Will Perform Lower Body Dressing: with modified independence;sit to/from stand Pt Will Transfer to Toilet: with modified independence;ambulating (3 in 1 over toilet) Pt Will Perform Toileting - Clothing Manipulation and hygiene: with modified independence;sit to/from stand Pt Will Perform Tub/Shower Transfer: Tub transfer;with supervision;ambulating;tub bench;rolling walker Additional ADL Goal #1: Pt will don/doff back brace independently.  OT Frequency: Min 2X/week   Barriers to D/C:            Co-evaluation              End of Session Equipment Utilized During Treatment: Gait belt;Rolling walker;Back brace;Oxygen Nurse Communication: Other (comment) (pt up in chair; became dizzy)  Activity Tolerance: Other (comment) (dizzy) Patient left: in chair;with call bell/phone within reach;with family/visitor present   Time: [id number] OT Time Calculation (min): 32 min Charges:  OT General Charges $OT Visit: 1 Procedure OT Evaluation $Initial OT Evaluation Tier I: 1 Procedure OT Treatments $Self Care/Home Management : 8-22 mins G-Codes:    Earlie RavelingLindsey L Khole Branch OTR/L [phone] 05/19/2014, 5:52 PM

## 2014-05-19 NOTE — H&P (Signed)
History of Present Illness  The patient is a 48 year old male who comes in today for a preoperative History and Physical. The patient is scheduled for a to be performed by Dr. Debria Garret D. Shon Baton, MD at [facility] on 05/19/2014 . Please see the hospital record for complete dictated history and physical.  [age] YO white male with a history of L5-S1 DDD and chronic back pain returns. He states that symptoms are unchanged from previous visit. Pain is only in the lumbar spine and there is nothing down his legs. He is wanting to proceed with L5-S1 total disc replacement vs. ALIF as scheduled.   Allergies Benadryl Allergy *ANTIHISTAMINES*    Family History Cancer. mother Diabetes Mellitus. mother Heart Disease. father Hypertension. father Cerebrovascular Accident. Father. father    Social History Alcohol use. never consumed alcohol Children. 2 Current work status. working full time Drug/Alcohol Rehab (Currently). no Exercise. Exercises monthly; does other Illicit drug use. no Living situation. live with spouse Marital status. 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 2-3 Pain Contract. no Previously in rehab. no Tobacco / smoke exposure. no Tobacco use. Never smoker. never smoker    Medication History Atorvastatin Calcium (40MG  Tablet, Oral) Active. Losartan Potassium (50MG  Tablet, Oral) Active. Meloxicam (15MG  Tablet, Oral) Active. Omeprazole (20MG  Capsule DR, Oral) Active. Sertraline HCl (100MG  Tablet, Oral) Active. SUMAtriptan Succinate (100MG  Tablet, Oral) Active. Testosterone Cypionate (100MG /ML Solution, Intramuscular) Active. Zolpidem Tartrate (10MG  Tablet, Oral) Active. Medications Reconciled.    Vitals 05/11/2014 8:27 AM Weight: 231.31 lb Height: 67 in Body Surface Area: 2.23 m Body Mass Index: 36.23 kg/m Pulse: 92  (Regular) BP: 137/79 (Sitting, Left Arm, Standard)     Objective Transcription  On exam, he is a very pleasant white male, A&O times 3 and in no acute distress. No increase in respiratory effort.  HEAD: Normocephalic, atraumatic.  PERRLA, EOMI.  LUNGS: CTA bilaterally, no wheezing noted.  HEART: RRR, no murmurs noted.  ABDOMEN: Round, nondistended, NBS times 4. Soft and nontender.  NEUROLOGICAL: He is neurologically intact.  SKIN: Warm and dry.   Plans Transcription  His MRI was actually last done in September. Because of the long delay with dealing with the insurance approvals, his MRI is now greater than 6 months out and so I am going to get a STAT MRI for his preoperative evaluation. He has his preoperative testing this Thursday and we will plan on surgery next week, Wednesday. I did tell him unfortunately I will be out of town Friday evening for one week, but my partners will be available if there are any complications, which I do not foresee. We reviewed the risks which include infection, bleeding, nerve damage, death, stroke, paralysis, failure to heal, need for further surgery, ongoing or worse pain, retrograde ejaculation, and need for fusion instead of disc replacement. All his and his wife's questions were addressed.  No new changes on MRI.  No significant facet arthropathy noted, collapse and degenerative changes noted of the disc.

## 2014-05-20 ENCOUNTER — Inpatient Hospital Stay (HOSPITAL_COMMUNITY): Payer: Managed Care, Other (non HMO)

## 2014-05-20 DIAGNOSIS — Z09 Encounter for follow-up examination after completed treatment for conditions other than malignant neoplasm: Secondary | ICD-10-CM

## 2014-05-20 NOTE — Op Note (Signed)
Cory Holland                ACCOUNT NO.:  [id number]  MEDICAL RECORD NO.:  [id number]  LOCATION:  [id number]                        FACILITY:  [facility]  PHYSICIAN:  Alvy Beal, MD    DATE OF BIRTH:  1966-01-05  DATE OF PROCEDURE:  05/19/2014 DATE OF DISCHARGE:                              OPERATIVE REPORT   PREOPERATIVE DIAGNOSIS:  Degenerative disk disease, L5-S1.  POSTOPERATIVE DIAGNOSIS:  Degenerative disk disease, L5-S1.  OPERATIVE PROCEDURE:  Total disk arthroplasty, L5-S1 approach.  SURGEON:  Dr. Colin Benton  FIRST ASSISTANT:  Genene Churn. Denton Meek.  IMPLANT USED:  DePuy __________ size 10, 11-degree lordotic medium implant.  INDICATIONS:  Cory Holland is a very pleasant 48 year old gentleman who unfortunately has been having severe progressive debilitating back pain. He has all flexion mediated pain, no significant extension pain.  He has had attempts at conservative management consisting of prolonged physiotherapy counseling, injection therapy, activity modification, and he continues to suffer.  As a result, we elected to proceed with surgery.  All appropriate risks, benefits, and alternatives were discussed with the patient.  Consent was obtained.  OPERATIVE NOTE:  The patient was brought to the operating room, placed supine on the operating table.  After successful induction of general anesthesia and endotracheal intubation, TEDs, SCDs, and a Foley were inserted.  The abdomen was prepped and draped in a standard fashion.  Dr. Colin Benton then scrubbed in and performed a standard retroperitoneal approach to the lumbar spine.  Please refer to his dictation for specifics.  Once he had exposed the L5-S1 level and retractors were placed, the needle was placed into the disk space.  An intraoperative lateral fluoro confirmed that this was the appropriate level.  Once this was completed, I then scrubbed into the case.  An annulotomy was performed with a 10-blade scalpel, then using  a combination of pituitary rongeurs and Kerrison rongeurs, I removed the bulk of the disk material.  I then used paddle distractors to open up the collapsed disk space and continued to work posteriorly.  Using curettes and the pituitary rongeurs, I removed all of the disk space, so it was down to the posterior margin of the vertebral body.  Then, using a 2-mm Kerrison, I trimmed down the posterior osteophyte and released the annulus.  At this point, I then took a curved nerve hook and passed it along the entire posterior rim of L5 and S1 confirming that the annulus had adequately been released.  I could now freely palpate behind the vertebral body.  I then made sure I had all the cartilage endplate removed and had bleeding subchondral bone.  I then removed the larger ring in the anterior osteophytes.  Once this was done, I then placed the trial implants and malleted it to the appropriate depth.  Once this was appropriately positioned, I checked both the AP and lateral planes to confirm that it was essentially in the midline based on the spinous process and the interspinous process distance.  Once this was confirmed, I then took the osteotome and malleted over the guide to create a thin cut.  I then irrigated the wound copiously with normal saline and then obtained the appropriate size  actual prosthesis and malleted it into place.  Great care was taken to protect the iliac vessels during this portion, so there was no injury.  Once the implant was well seated and down to the same depth it was on the trial, I then inserted the poly insert.  I then checked to make sure there was no step-off and no gap and there was not, I removed all the inserting devices, irrigated the wound copiously with normal saline, and made sure I had hemostasis using bone rasp, bipolar electrocautery, and FloSeal.  I then removed all the retractors sequentially and there was no active bleeding.  I closed the fascia of  the rectus with running #1 Prolene and then a layered 2-0 and a 3-0 Monocryl for the skin.  Steri-Strips and a dry dressing were applied.  The patient was ultimately extubated and transferred to PACU without incident.  At the end of the case, all needle and sponge counts were correct.  There was no adverse intraoperative events.     Alvy Bealahari D Haili Donofrio, MD     DDB/MEDQ  D:  05/19/2014  T:  05/20/2014  Job:  [id number]

## 2014-05-20 NOTE — Care Management Note (Signed)
CARE MANAGEMENT NOTE 05/20/2014  Patient:  Cory Holland, Cory Holland   Account Number:  [id number]  Date Initiated:  05/20/2014  Documentation initiated by:  Vance Peper  Subjective/Objective Assessment:   48 yr old male s/p L5-S1 disc replacement.     Action/Plan:   Case manager spoke with patient and wife.He  has no home health needs. Will need 3in1.  Family support at discharge.   Anticipated DC Date:  05/21/2014   Anticipated DC Plan:  HOME/SELF CARE      DC Planning Services  CM consult      PAC Choice  DURABLE MEDICAL EQUIPMENT   Choice offered to / List presented to:     DME arranged  3-N-1      DME agency  [facility]     HH arranged  NA      HH agency  NA   Status of service:  Completed, signed off Medicare Important Message given?   (If response is "NO", the following Medicare IM given date fields will be blank) Date Medicare IM given:   Date Additional Medicare IM given:    Discharge Disposition:  HOME/SELF CARE  Per UR Regulation:  Reviewed for med. necessity/level of care/duration of stay  If discussed at Long Length of Stay Meetings, dates discussed:    Comments:

## 2014-05-20 NOTE — Progress Notes (Signed)
Clinical Social Worker will sign off for now as social work intervention is no longer needed. Please consult us again if new need arises.   Kataryna Mcquilkin, MSW, LCSWA [phone] 

## 2014-05-20 NOTE — Progress Notes (Signed)
*  PRELIMINARY RESULTS* Vascular Ultrasound Lower extremity venous duplex has been completed.  Preliminary findings: no evidence of DVT.  Farrel Demark, RDMS, RVT  05/20/2014, 9:56 AM

## 2014-05-20 NOTE — Progress Notes (Signed)
    Subjective: Procedure(s) (LRB): TOTAL DISC REPLACEMENT L5-S1  (1 LEVEL) (N/A) ABDOMINAL EXPOSURE (N/A) 1 Day Post-Op  Patient reports pain as 3 on 0-10 scale.  Reports decreased leg pain reports incisional back pain   Positive void Negative bowel movement Positive flatus Negative chest pain or shortness of breath  Objective: Vital signs in last 24 hours: Temp:  [97.4 F (36.3 C)-99.1 F (37.3 C)] 97.8 F (36.6 C) (06/04 1001) Pulse Rate:  [80-115] 101 (06/04 1001) Resp:  [14-24] 16 (06/04 1001) BP: (95-111)/(49-69) 105/64 mmHg (06/04 1001) SpO2:  [95 %-100 %] 95 % (06/04 1001) FiO2 (%):  [98 %] 98 % (06/04 0310)  Intake/Output from previous day: 06/03 0701 - 06/04 0700 In: 3573 [P.O.:360; I.V.:2813; IV Piggyback:400] Out: 4100 [Urine:3700; Blood:400]  Labs: No results found for this basename: WBC, RBC, HCT, PLT,  in the last 72 hours No results found for this basename: NA, K, CL, CO2, BUN, CREATININE, GLUCOSE, CALCIUM,  in the last 72 hours No results found for this basename: LABPT, INR,  in the last 72 hours  Physical Exam: Neurologically intact ABD soft Intact pulses distally Incision: dressing C/D/I Compartment soft doppler - neg.  Assessment/Plan: Patient stable  xrays satisfactory Continue mobilization with physical therapy Continue care  Advance diet Up with therapy Plan for discharge tomorrow   Venita Lick, MD [facility] [phone]

## 2014-05-20 NOTE — Progress Notes (Signed)
Occupational Therapy Treatment Patient Details Name: Cory LewandowskyDennis Holland MRN: [id number] DOB: 1966-06-22 Today's Date: 05/20/2014    History of present illness 48 y.o. s/p TOTAL DISC REPLACEMENT L5-S1  (1 LEVEL)    OT comments  Focus of today's session was on practicing don/doff of brace and AE education for LB bathing, dressing and toilet hygiene. Pt educated on and demonstrated use of AE, restated precautions and ways to modify tasks to adhere to back precautions. Pt was able to don/doff brace independently. Pt is progressing towards goals and will have assistance at home, therefore d/c plan remains appropriate.  Follow Up Recommendations  No OT follow up;Supervision - Intermittent    Equipment Recommendations  3 in 1 bedside comode       Precautions / Restrictions Precautions Precautions: Back Precaution Comments: Reviewed precautions with pt/family Required Braces or Orthoses: Spinal Brace Spinal Brace: Lumbar corset;Applied in sitting position Restrictions Weight Bearing Restrictions: No       Mobility Bed Mobility Overal bed mobility: Needs Assistance Bed Mobility: Rolling;Sidelying to Sit Rolling: Supervision Sidelying to sit: Supervision       General bed mobility comments: VC for log roll technique to prevent twisting through trunk.  No physical assist required.  Transfers Overall transfer level: Needs assistance Equipment used: None Transfers: Sit to/from Stand Sit to Stand: Modified independent (Device/Increase time)            Balance Overall balance assessment: Needs assistance Sitting-balance support: No upper extremity supported Sitting balance-Leahy Scale: Good Sitting balance - Comments: pt demonstrated don/doff of back brace without physical assistance.   Standing balance support: No upper extremity supported Standing balance-Leahy Scale: Fair Standing balance comment: Pt able to stand and walk without physical support or UE support. Supervision  provided for safety.                   ADL Overall ADL's : Needs assistance/impaired             Lower Body Bathing: Modified independent;With adaptive equipment;Adhering to back precautions;Sit to/from stand   Upper Body Dressing : Sitting;Modified independent   Lower Body Dressing: Sit to/from stand;Adhering to back precautions;With adaptive equipment;Modified independent   Toilet Transfer: Ambulation;RW;Supervision/safety;BSC StatisticianToilet Transfer Details (indicate cue type and reason): BSC over toilet  Toileting- Clothing Manipulation and Hygiene: Modified independent;Sit to/from stand;With adaptive equipment Toileting - Clothing Manipulation Details (indicate cue type and reason): using toilet aide     Functional mobility during ADLs: Rolling walker;Supervision/safety General ADL Comments: Educated on and practiced toilet aide to assist with toilet hygiene and AE for LB bathing and dressing. Pt verbally restated precautions and compensatory strategies to use when modifying ADLs.                Cognition   Behavior During Therapy: WFL for tasks assessed/performed Overall Cognitive Status: Within Functional Limits for tasks assessed                       Extremity/Trunk Assessment  Upper Extremity Assessment Upper Extremity Assessment: Defer to OT evaluation   Lower Extremity Assessment Lower Extremity Assessment: Defer to PT                    Pertinent Vitals/ Pain       No c/o pain. Pt was premedicated prior to treatment.  Home Living Family/patient expects to be discharged to:: Private residence Living Arrangements: Spouse/significant other Available Help at Discharge: Family;Available 24 hours/day Type of Home: House  Home Access: Stairs to enter Entrance Stairs-Number of Steps: 5 Entrance Stairs-Rails: Left                 Home Equipment: Hand held shower head;Tub bench;Walker - standard          Prior  Functioning/Environment Level of Independence: Independent            Frequency Min 2X/week     Progress Toward Goals  OT Goals(current goals can now be found in the care plan section)  Progress towards OT goals: Progressing toward goals  Acute Rehab OT Goals Patient Stated Goal: to go home OT Goal Formulation: With patient Time For Goal Achievement: 05/26/14 Potential to Achieve Goals: Good  Plan Discharge plan remains appropriate       End of Session Equipment Utilized During Treatment: Back brace   Activity Tolerance Patient tolerated treatment well   Patient Left in bed;with call bell/phone within reach;with family/visitor present;with nursing/sitter in room           Time: 1410-1448 OT Time Calculation (min): 38 min  Charges: OT General Charges $OT Visit: 1 Procedure OT Treatments $Self Care/Home Management : 38-52 mins  Maurene Capes 05/20/2014, 3:14 PM

## 2014-05-20 NOTE — Evaluation (Signed)
Physical Therapy Evaluation Patient Details Name: Cory LewandowskyDennis Holland MRN: [id number] DOB: 1966/11/17 Today's Date: 05/20/2014   History of Present Illness  48 y.o. s/p TOTAL DISC REPLACEMENT L5-S1  (1 LEVEL)   Clinical Impression  Pt ambulated 400 ft with RW and supervision for balance safety and navigated 5 steps with L handrail with VC to feel for next step before shifting weight.  Pt did not required physical assistance with any of therapy activities today except for slight assist to power up from sitting to standing.  Pt plans to return home with wife upon d/c.  He and his wife would like a 3-in-1 for easier toilet access.  Pt will benefit from PT services to continue to build upon gains in mobility.  Will continue to follow.    Follow Up Recommendations No PT follow up;Supervision for mobility/OOB    Equipment Recommendations  3in1 (PT)    Recommendations for Other Services       Precautions / Restrictions Precautions Precautions: Back Precaution Comments: Reviewed precautions with pt/family Required Braces or Orthoses: Spinal Brace Spinal Brace: Lumbar corset;Applied in sitting position Restrictions Weight Bearing Restrictions: No      Mobility  Bed Mobility Overal bed mobility: Needs Assistance Bed Mobility: Rolling;Sidelying to Sit Rolling: Supervision Sidelying to sit: Min guard       General bed mobility comments: VC for log roll technique to prevent twisting through trunk.  No physical assist required.  Transfers Overall transfer level: Needs assistance Equipment used: Rolling walker (2 wheeled) Transfers: Sit to/from Stand Sit to Stand: Min assist         General transfer comment: VC for hand placement, pt able to power up to standing with min-A .  Ambulation/Gait Ambulation/Gait assistance: Supervision Ambulation Distance (Feet): 400 Feet Assistive device: Rolling walker (2 wheeled);None Gait Pattern/deviations: Step-through pattern;Decreased stride  length     General Gait Details: Pt ambulated first 200 ft with RW, then last 200 ft with no AD and supervision for safety.  Pt ambulating at near his normal velocity according to wife.   Stairs Stairs: Yes Stairs assistance: Min guard Stair Management: One rail Left;Alternating pattern;Forwards Number of Stairs: 5 General stair comments: VC to feel for next step with foot before shifting weight. Pt required no physical assist.  Wheelchair Mobility    Modified Rankin (Stroke Patients Only)       Balance Overall balance assessment: Needs assistance         Standing balance support: No upper extremity supported;During functional activity Standing balance-Leahy Scale: Fair Standing balance comment: Pt able to stand and walk with no UE support without LOB. Supervision provided for safety due to decreased ability to react to potential perturbations or LOB.                             Pertinent Vitals/Pain Pt reports some pain at his incision site, does not desire pain meds at this time.    Home Living Family/patient expects to be discharged to:: Private residence Living Arrangements: Spouse/significant other Available Help at Discharge: Family;Available 24 hours/day Type of Home: House Home Access: Stairs to enter Entrance Stairs-Rails: Left Entrance Stairs-Number of Steps: 5   Home Equipment: Hand held shower head;Tub bench;Walker - standard      Prior Function Level of Independence: Independent               Hand Dominance   Dominant Hand: Right    Extremity/Trunk Assessment  Upper Extremity Assessment: Defer to OT evaluation           Lower Extremity Assessment: Overall WFL for tasks assessed         Communication   Communication: No difficulties  Cognition Arousal/Alertness: Awake/alert Behavior During Therapy: WFL for tasks assessed/performed Overall Cognitive Status: Within Functional Limits for tasks assessed                       General Comments General comments (skin integrity, edema, etc.): Pt asking for further guidance on how to approach bathroom hygeine and is interested in potentially getting tongs for assistance.    Exercises        Assessment/Plan    PT Assessment Patient needs continued PT services  PT Diagnosis Acute pain;Abnormality of gait   PT Problem List Decreased range of motion;Decreased balance;Decreased activity tolerance;Decreased mobility;Decreased knowledge of precautions;Pain  PT Treatment Interventions Stair training;Gait training;Functional mobility training;Therapeutic activities;Balance training;Patient/family education   PT Goals (Current goals can be found in the Care Plan section) Acute Rehab PT Goals Patient Stated Goal: not stated PT Goal Formulation: With patient/family Time For Goal Achievement: 05/28/14 Potential to Achieve Goals: Good    Frequency Min 5X/week   Barriers to discharge        Co-evaluation               End of Session Equipment Utilized During Treatment: Gait belt;Back brace Activity Tolerance: Patient tolerated treatment well Patient left: in chair;with call bell/phone within reach;with family/visitor present Nurse Communication: Mobility status         Time: 2952-8413 PT Time Calculation (min): 34 min   Charges:   PT Evaluation $Initial PT Evaluation Tier I: 1 Procedure PT Treatments $Gait Training: 23-37 mins   PT G Codes:          Feliciana Narayan, SPT 05/20/2014, 1:02 PM

## 2014-05-20 NOTE — Progress Notes (Signed)
Agree with note. 05/20/2014 Yaslyn Cumby, OTR/L Pager: [phone] 

## 2014-05-20 NOTE — Evaluation (Signed)
Agree with SPT.    Kaliah Haddaway, PT [phone]  

## 2014-05-21 ENCOUNTER — Encounter (HOSPITAL_COMMUNITY): Payer: Self-pay | Admitting: Orthopedic Surgery

## 2014-05-21 MED ORDER — ENOXAPARIN SODIUM 40 MG/0.4ML ~~LOC~~ SOLN
40.0000 mg | SUBCUTANEOUS | Status: DC
  Filled 2014-05-21: qty 0.4

## 2014-05-21 MED ORDER — ENOXAPARIN SODIUM 30 MG/0.3ML ~~LOC~~ SOLN: 30.0000 mg | Freq: Two times a day (BID) | SUBCUTANEOUS | Status: DC

## 2014-05-21 MED ORDER — ENOXAPARIN (LOVENOX) PATIENT EDUCATION KIT
PACK | Freq: Once | Status: DC
  Filled 2014-05-21: qty 1

## 2014-05-21 MED ORDER — MAGNESIUM CITRATE PO SOLN
0.5000 | Freq: Once | ORAL | Status: AC
  Administered 2014-05-21: 148 via ORAL
  Filled 2014-05-21: qty 296

## 2014-05-21 MED ORDER — DOCUSATE SODIUM 100 MG PO CAPS: 100.0000 mg | ORAL_CAPSULE | Freq: Two times a day (BID) | ORAL | Status: DC

## 2014-05-21 MED ORDER — OXYCODONE-ACETAMINOPHEN 10-325 MG PO TABS: 1.0000 | ORAL_TABLET | ORAL | Status: DC | PRN

## 2014-05-21 MED ORDER — FLEET ENEMA 7-19 GM/118ML RE ENEM
1.0000 | ENEMA | Freq: Once | RECTAL | Status: AC
  Administered 2014-05-21: 13:00:00 via RECTAL
  Filled 2014-05-21: qty 1

## 2014-05-21 MED ORDER — POLYETHYLENE GLYCOL 3350 17 G PO PACK: 17.0000 g | PACK | Freq: Every day | ORAL | Status: DC

## 2014-05-21 MED ORDER — ONDANSETRON 4 MG PO TBDP: 4.0000 mg | ORAL_TABLET | Freq: Three times a day (TID) | ORAL | Status: DC | PRN

## 2014-05-21 MED ORDER — METHOCARBAMOL 500 MG PO TABS: 500.0000 mg | ORAL_TABLET | Freq: Four times a day (QID) | ORAL | Status: DC | PRN

## 2014-05-21 NOTE — Progress Notes (Signed)
Agree with SPT.    Christophere Hillhouse, PT [phone]  

## 2014-05-21 NOTE — Progress Notes (Signed)
    Subjective: Procedure(s) (LRB): TOTAL DISC REPLACEMENT L5-S1  (1 LEVEL) (N/A) ABDOMINAL EXPOSURE (N/A) 2 Days Post-Op  Patient reports pain as 1 on 0-10 scale.  Reports decreased leg pain reports incisional pain   Positive void Negative bowel movement Positive flatus Negative chest pain or shortness of breath  Objective: Vital signs in last 24 hours: Temp:  [97.5 F (36.4 C)-97.9 F (36.6 C)] 97.5 F (36.4 C) (06/05 0602) Pulse Rate:  [78-103] 78 (06/05 0602) Resp:  [16] 16 (06/05 0602) BP: (105-119)/(64-73) 119/70 mmHg (06/05 0602) SpO2:  [95 %-98 %] 98 % (06/05 0602) Weight:  [230 lb 2.6 oz (104.4 kg)] 230 lb 2.6 oz (104.4 kg) (06/04 1125)  Intake/Output from previous day: 06/04 0701 - 06/05 0700 In: 1133 [P.O.:960; I.V.:173] Out: 4200 [Urine:4200]  Labs: No results found for this basename: WBC, RBC, HCT, PLT,  in the last 72 hours No results found for this basename: NA, K, CL, CO2, BUN, CREATININE, GLUCOSE, CALCIUM,  in the last 72 hours No results found for this basename: LABPT, INR,  in the last 72 hours  Physical Exam: Neurologically intact ABD soft Neurovascular intact Intact pulses distally Dorsiflexion/Plantar flexion intact Incision: dressing C/D/I Compartment soft  Assessment/Plan: Patient stable  xrays satisfactory Continue mobilization with physical therapy Continue care  Advance diet Up with therapy Plan for discharge tomorrow possible today if able to move bowels Doing very well   Venita Lick, MD [facility] [phone]

## 2014-05-21 NOTE — Progress Notes (Signed)
Pt and wife instructed on giving lovenox ing verbalized understanding

## 2014-05-21 NOTE — Progress Notes (Signed)
Physical Therapy Treatment Patient Details Name: Cory Holland MRN: [id number] DOB: Mar 08, 1966 Today's Date: 05/21/2014    History of Present Illness 48 y.o. s/p TOTAL DISC REPLACEMENT L5-S1  (1 LEVEL)     PT Comments    Pt required supervision for sidelying to sit and sit to stand transfers for reminders about hand placement.  Pt mod-I otherwise for all mobility with no AD.  Pt navigated two flights of stairs with L hand rail today and alternating foot placement.  Pt plans to return to home today.  Will continue to follow until pt is d/c from acute inpatient care.   Follow Up Recommendations  No PT follow up;Supervision for mobility/OOB     Equipment Recommendations  3in1 (PT)    Recommendations for Other Services       Precautions / Restrictions Precautions Precautions: Back Precaution Comments: Reviewed precautions with pt/family Required Braces or Orthoses: Spinal Brace Spinal Brace: Lumbar corset;Applied in sitting position Restrictions Weight Bearing Restrictions: No    Mobility  Bed Mobility Overal bed mobility: Needs Assistance Bed Mobility: Rolling;Sidelying to Sit Rolling: Modified independent (Device/Increase time) Sidelying to sit: Supervision       General bed mobility comments: Pt performed bed mobility with no VC with bed flat and no use of hand rails.  Supervision with power up to sitting due to pt hesitation and slow power up.  Transfers Overall transfer level: Needs assistance Equipment used: None Transfers: Sit to/from Stand Sit to Stand: Supervision         General transfer comment: Pt reminded of appropriate hand placement.  Ambulation/Gait Ambulation/Gait assistance: Modified independent (Device/Increase time) Ambulation Distance (Feet): 400 Feet Assistive device: None Gait Pattern/deviations: Step-through pattern;Decreased stride length     General Gait Details: No assistance needed with ambulation.   Stairs Stairs: Yes Stairs  assistance: Modified independent (Device/Increase time) Stair Management: One rail Left;Alternating pattern;Forwards Number of Stairs: 20 General stair comments: Pt ascended/descended 2 flights of stairs with no assistance.  Pt reports slight increase in pain when powering up with L LE.  Wheelchair Mobility    Modified Rankin (Stroke Patients Only)       Balance Overall balance assessment: Needs assistance         Standing balance support: No upper extremity supported;During functional activity Standing balance-Leahy Scale: Good Standing balance comment: Pt ambulates with no UE support and is able to shift weight and turn well.  Pt's standing balance still limited by back precautions.                    Cognition Arousal/Alertness: Awake/alert Behavior During Therapy: WFL for tasks assessed/performed Overall Cognitive Status: Within Functional Limits for tasks assessed                      Exercises      General Comments        Pertinent Vitals/Pain Pt reports small increase in pain with WB through L LE.  Pt does not request pain meds at this time.    Home Living                      Prior Function            PT Goals (current goals can now be found in the care plan section) Acute Rehab PT Goals Patient Stated Goal: to go home PT Goal Formulation: With patient/family Time For Goal Achievement: 05/28/14 Potential to Achieve Goals: Good Progress towards PT  goals: Progressing toward goals    Frequency  Min 5X/week    PT Plan Current plan remains appropriate    Co-evaluation             End of Session Equipment Utilized During Treatment: Gait belt;Back brace Activity Tolerance: Patient tolerated treatment well Patient left: in chair;with call bell/phone within reach;with family/visitor present     Time: 0731-0752 PT Time Calculation (min): 21 min  Charges:                       G Codes:      Emary Zalar,  SPT 05/21/2014, 8:34 AM

## 2014-05-27 NOTE — Discharge Summary (Signed)
Patient ID: Cory Holland MRN: [id number] DOB/AGE: January 07, 1966 48 y.o.  Admit date: 05/19/2014 Discharge date: 05/27/2014  Admission Diagnoses:  Active Problems:   Back pain   Discharge Diagnoses:  Active Problems:   Back pain  status post Procedure(s): TOTAL DISC REPLACEMENT L5-S1  (1 LEVEL) ABDOMINAL EXPOSURE  Past Medical History  Diagnosis Date  . Shortness of breath     with exertion  . GERD (gastroesophageal reflux disease)   . Headache(784.0)     migraines  . Hypertension     Surgeries: Procedure(s): TOTAL DISC REPLACEMENT L5-S1  (1 LEVEL) ABDOMINAL EXPOSURE on 05/19/2014   Consultants:    Discharged Condition: Improved  Hospital Course: Cory LewandowskyDennis Renteria is an 48 y.o. male who was admitted 05/19/2014 for operative treatment of lumbar stenosis/ddd. Patient failed conservative treatments (please see the history and physical for the specifics) and had severe unremitting pain that affects sleep, daily activities and work/hobbies. After pre-op clearance, the patient was taken to the operating room on 05/19/2014 and underwent  Procedure(s): TOTAL DISC REPLACEMENT L5-S1  (1 LEVEL) ABDOMINAL EXPOSURE.    Patient was given perioperative antibiotics:  Anti-infectives   Start     Dose/Rate Route Frequency Ordered Stop   05/19/14 1600  ceFAZolin (ANCEF) IVPB 1 g/50 mL premix     1 g 100 mL/hr over 30 Minutes Intravenous Every 8 hours 05/19/14 1439 05/20/14 0122   05/18/14 1429  ceFAZolin (ANCEF) IVPB 2 g/50 mL premix     2 g 100 mL/hr over 30 Minutes Intravenous 30 min pre-op 05/18/14 1429 05/19/14 0843       Patient was given sequential compression devices and early ambulation to prevent DVT.   Patient benefited maximally from hospital stay and there were no complications. At the time of discharge, the patient was urinating/moving their bowels without difficulty, tolerating a regular diet, pain is controlled with oral pain medications and they have been cleared by PT/OT.    Recent vital signs: No data found.    Recent laboratory studies: No results found for this basename: WBC, HGB, HCT, PLT, NA, K, CL, CO2, BUN, CREATININE, GLUCOSE, PT, INR, CALCIUM, 2,  in the last 72 hours   Discharge Medications:     Medication List    STOP taking these medications       ibuprofen 200 MG tablet  Commonly known as:  ADVIL,MOTRIN     meloxicam 15 MG tablet  Commonly known as:  MOBIC     naproxen 250 MG tablet  Commonly known as:  NAPROSYN      TAKE these medications       atorvastatin 40 MG tablet  Commonly known as:  LIPITOR  Take 40 mg by mouth daily.     docusate sodium 100 MG capsule  Commonly known as:  COLACE  Take 1 capsule (100 mg total) by mouth 2 (two) times daily.     enoxaparin 30 MG/0.3ML injection  Commonly known as:  LOVENOX  Inject 0.3 mLs (30 mg total) into the skin every 12 (twelve) hours.     losartan 50 MG tablet  Commonly known as:  COZAAR  Take 50 mg by mouth daily.     methocarbamol 500 MG tablet  Commonly known as:  ROBAXIN  Take 1 tablet (500 mg total) by mouth every 6 (six) hours as needed for muscle spasms.     omeprazole 20 MG capsule  Commonly known as:  PRILOSEC  Take 20 mg by mouth daily as needed.  ondansetron 4 MG disintegrating tablet  Commonly known as:  ZOFRAN ODT  Take 1 tablet (4 mg total) by mouth every 8 (eight) hours as needed.     oxyCODONE-acetaminophen 10-325 MG per tablet  Commonly known as:  PERCOCET  Take 1 tablet by mouth every 4 (four) hours as needed for pain.     polyethylene glycol packet  Commonly known as:  MIRALAX / GLYCOLAX  Take 17 g by mouth daily.     sertraline 100 MG tablet  Commonly known as:  ZOLOFT  Take 100 mg by mouth daily.     SUMAtriptan 100 MG tablet  Commonly known as:  IMITREX  Take 100 mg by mouth every 2 (two) hours as needed for migraine or headache. May repeat in 2 hours if headache persists or recurs.     testosterone cypionate 200 MG/ML injection   Commonly known as:  DEPOTESTOTERONE CYPIONATE  Inject 200 mg into the muscle every 28 (twenty-eight) days.     zolpidem 10 MG tablet  Commonly known as:  AMBIEN  Take 10 mg by mouth at bedtime as needed for sleep.        Diagnostic Studies: Dg Chest 2 View  05/19/2014   CLINICAL DATA:  Hypertension; preoperative lumbar surgery  EXAM: CHEST  2 VIEW  COMPARISON:  None.  FINDINGS: Lungs are clear. Heart size and pulmonary vascularity are normal. No adenopathy. No bone lesions.  IMPRESSION: No abnormality noted.   Electronically Signed   By: Bretta Bang M.D.   On: 05/19/2014 07:23   Dg Lumbar Spine 2-3 Views  05/20/2014   CLINICAL DATA:  Postoperative evaluation following lumbar discectomy and fusion.  EXAM: LUMBAR SPINE - 2-3 VIEW  COMPARISON:  Lumbar spine CT scan 01/22/2014. Lumbar spine radiograph 05/13/2014.  FINDINGS: Postoperative changes of diskectomy and fusion at L5-S1 are noted with and artificial disc implant at the L5-S1 interspace. Alignment is anatomic. Mild multilevel degenerative disc disease, now most severe at T11-T12 and L4-L5. Multilevel facet arthropathy, most severe at L5-S1.  IMPRESSION: 1. Postoperative changes of L5-S1 discectomy and artificial disc implantation redemonstrated, without complicating features.   Electronically Signed   By: Trudie Reed M.D.   On: 05/20/2014 08:29   Dg Lumbar Spine 2-3 Views  05/19/2014   CLINICAL DATA:  back and leg pain  EXAM: LUMBAR SPINE - 2-3 VIEW  COMPARISON:  Two-view lumbar spine 05/13/2014.  FINDINGS: An L5-S1 disc prostheses has been inserted. Hardware appears intact. Native osseous structures appear unremarkable.  IMPRESSION: Postop L5-S1 disc prostheses insertion.   Electronically Signed   By: Salome Holmes M.D.   On: 05/19/2014 12:35   Dg Lumbar Spine 2-3 Views  05/13/2014   CLINICAL DATA:  pre-op fusion - number levels  EXAM: LUMBAR SPINE - 2-3 VIEW  COMPARISON:  Lower lumbar spine discography - 01/22/2014  FINDINGS:  There are 5 non rib-bearing lumbar type vertebral bodies.  There is a mild scoliotic curvature of the thoracolumbar spine with dominant curvature convex to the right. No anterolisthesis or retrolisthesis.  Lumbar vertebral body heights are preserved.  There is grossly unchanged severe DDD of L5-S1 with disc space height loss, endplate irregularity and sclerosis. Unchanged mild DDD of L4-L5. Remaining intervertebral disc space heights appear preserved.  Limited visualization of bilateral SI joints is normal. Regional soft tissues appear normal.  IMPRESSION: 1. Grossly unchanged severe DDD of L5 - S1. 2. Grossly unchanged mild DDD of L4 - L5.   Electronically Signed   By: Jonny Ruiz  Watts M.D.   On: 05/13/2014 09:10   Dg C-arm 61-120 Min  05/19/2014   CLINICAL DATA:  Status post total disc replacement.  EXAM: OR LOCAL ABDOMEN; DG C-ARM 61-120 MIN  COMPARISON:  Lumbar spine radiographs 05/13/2014  FINDINGS: The patient is status post disk replacement at L5-S1. Alignment is anatomic. Disc height is restored. The pelvis is otherwise unremarkable.  IMPRESSION: Status post disk replacement at L5-S1 without radiographic evidence for complication.  These results were called by telephone at the time of interpretation on 05/19/2014 at 12:17 PM to Dr. Venita Lick , who verbally acknowledged these results.   Electronically Signed   By: Gennette Pac M.D.   On: 05/19/2014 12:17   Dg Or Local Abdomen  05/19/2014   CLINICAL DATA:  Status post total disc replacement.  EXAM: OR LOCAL ABDOMEN; DG C-ARM 61-120 MIN  COMPARISON:  Lumbar spine radiographs 05/13/2014  FINDINGS: The patient is status post disk replacement at L5-S1. Alignment is anatomic. Disc height is restored. The pelvis is otherwise unremarkable.  IMPRESSION: Status post disk replacement at L5-S1 without radiographic evidence for complication.  These results were called by telephone at the time of interpretation on 05/19/2014 at 12:17 PM to Dr. Venita Lick , who verbally  acknowledged these results.   Electronically Signed   By: Gennette Pac M.D.   On: 05/19/2014 12:17        Discharge Instructions   Call MD / Call 911    Complete by:  As directed   If you experience chest pain or shortness of breath, CALL 911 and be transported to the hospital emergency room.  If you develope a fever above 101 F, pus (white drainage) or increased drainage or redness at the wound, or calf pain, call your surgeon's office.     Constipation Prevention    Complete by:  As directed   Drink plenty of fluids.  Prune juice may be helpful.  You may use a stool softener, such as Colace (over the counter) 100 mg twice a day.  Use MiraLax (over the counter) for constipation as needed.     Diet - low sodium heart healthy    Complete by:  As directed      Discharge instructions    Complete by:  As directed   Ok to shower 5 days postop.  Do not apply any creams or ointments to incision.  Do not remove steri-strips.  Can use 4x4 gauze and tape for dressing changes.  No aggressive activity.  No bending, squatting or prolonged sitting.  Mostly be in reclined position or lying down.  Must wear brace when up and ambulating     Driving restrictions    Complete by:  As directed   No driving until further notice.     Increase activity slowly as tolerated    Complete by:  As directed      Lifting restrictions    Complete by:  As directed   No lifting until further notice.           Follow-up Information   Schedule an appointment as soon as possible for a visit with Alvy Beal, MD. (need return office visit 2 weeks postop)    Specialty:  Orthopedic Surgery   Contact information:   [address] [place] [address] [phone]       Discharge Plan:  discharge to home  Disposition:     Signed: Naida Sleight for Dr. Venita Lick [facility] [phone]  05/27/2014, 9:00 AM

## 2014-05-31 NOTE — Discharge Summary (Signed)
Agree with above Plan as dicussed

## 2014-06-04 ENCOUNTER — Ambulatory Visit (HOSPITAL_COMMUNITY)
Admission: RE | Admit: 2014-06-04 | Discharge: 2014-06-04 | Disposition: A | Discharge status: Home / Self Care | Payer: Managed Care, Other (non HMO) | Source: Ambulatory Visit | Attending: Cardiovascular Disease | Admitting: Cardiovascular Disease

## 2014-06-04 DIAGNOSIS — M79609 Pain in unspecified limb: Secondary | ICD-10-CM | POA: Insufficient documentation

## 2014-06-04 DIAGNOSIS — M7989 Other specified soft tissue disorders: Secondary | ICD-10-CM

## 2014-06-04 DIAGNOSIS — M79661 Pain in right lower leg: Secondary | ICD-10-CM

## 2014-06-04 NOTE — Progress Notes (Addendum)
Venous Lower Ext. Bilateral Completed. Negative for DVT. Marilynne Halstedita Sturdivant, BS, RDMS, RVT

## 2014-09-20 ENCOUNTER — Observation Stay (HOSPITAL_COMMUNITY)
Admission: AD | Admit: 2014-09-20 | Discharge: 2014-09-22 | Disposition: A | Discharge status: Home / Self Care | Payer: Managed Care, Other (non HMO) | Source: Ambulatory Visit | Attending: Orthopedic Surgery | Admitting: Orthopedic Surgery

## 2014-09-20 ENCOUNTER — Inpatient Hospital Stay (HOSPITAL_COMMUNITY): Payer: Managed Care, Other (non HMO)

## 2014-09-20 ENCOUNTER — Ambulatory Visit
Admission: RE | Admit: 2014-09-20 | Discharge: 2014-09-20 | Disposition: A | Discharge status: Home / Self Care | Payer: Managed Care, Other (non HMO) | Source: Ambulatory Visit | Attending: Orthopedic Surgery | Admitting: Orthopedic Surgery

## 2014-09-20 ENCOUNTER — Other Ambulatory Visit: Payer: Self-pay | Admitting: Orthopedic Surgery

## 2014-09-20 ENCOUNTER — Encounter (HOSPITAL_COMMUNITY): Payer: Self-pay | Admitting: General Practice

## 2014-09-20 DIAGNOSIS — G8918 Other acute postprocedural pain: Principal | ICD-10-CM | POA: Insufficient documentation

## 2014-09-20 DIAGNOSIS — M545 Low back pain, unspecified: Secondary | ICD-10-CM

## 2014-09-20 DIAGNOSIS — M549 Dorsalgia, unspecified: Secondary | ICD-10-CM | POA: Diagnosis present

## 2014-09-20 HISTORY — DX: Unspecified osteoarthritis, unspecified site: M19.90

## 2014-09-20 LAB — URINALYSIS, ROUTINE W REFLEX MICROSCOPIC
Bilirubin Urine: NEGATIVE
Glucose, UA: NEGATIVE mg/dL
Ketones, ur: NEGATIVE mg/dL
LEUKOCYTES UA: NEGATIVE
NITRITE: NEGATIVE
PROTEIN: NEGATIVE mg/dL
SPECIFIC GRAVITY, URINE: 1.018 (ref 1.005–1.030)
UROBILINOGEN UA: 0.2 mg/dL (ref 0.0–1.0)
pH: 5 (ref 5.0–8.0)

## 2014-09-20 LAB — PROTIME-INR
INR: 1.01 (ref 0.00–1.49)
Prothrombin Time: 13.3 seconds (ref 11.6–15.2)

## 2014-09-20 LAB — COMPREHENSIVE METABOLIC PANEL
ALBUMIN: 4 g/dL (ref 3.5–5.2)
ALK PHOS: 62 U/L (ref 39–117)
ALT: 29 U/L (ref 0–53)
AST: 24 U/L (ref 0–37)
Anion gap: 11 (ref 5–15)
BUN: 16 mg/dL (ref 6–23)
CALCIUM: 9.3 mg/dL (ref 8.4–10.5)
CO2: 26 mEq/L (ref 19–32)
Chloride: 102 mEq/L (ref 96–112)
Creatinine, Ser: 1.3 mg/dL (ref 0.50–1.35)
GFR calc Af Amer: 74 mL/min — ABNORMAL LOW (ref >90)
GFR calc non Af Amer: 64 mL/min — ABNORMAL LOW (ref >90)
GLUCOSE: 107 mg/dL — AB (ref 70–99)
POTASSIUM: 4.4 meq/L (ref 3.7–5.3)
SODIUM: 139 meq/L (ref 137–147)
Total Bilirubin: 0.7 mg/dL (ref 0.3–1.2)
Total Protein: 7.2 g/dL (ref 6.0–8.3)

## 2014-09-20 LAB — CBC
HEMATOCRIT: 39.2 % (ref 39.0–52.0)
HEMOGLOBIN: 13.3 g/dL (ref 13.0–17.0)
MCH: 29.4 pg (ref 26.0–34.0)
MCHC: 33.9 g/dL (ref 30.0–36.0)
MCV: 86.7 fL (ref 78.0–100.0)
Platelets: 209 10*3/uL (ref 150–400)
RBC: 4.52 MIL/uL (ref 4.22–5.81)
RDW: 13.6 % (ref 11.5–15.5)
WBC: 7.1 10*3/uL (ref 4.0–10.5)

## 2014-09-20 LAB — URINE MICROSCOPIC-ADD ON

## 2014-09-20 LAB — APTT: APTT: 29 s (ref 24–37)

## 2014-09-20 MED ORDER — SENNOSIDES-DOCUSATE SODIUM 8.6-50 MG PO TABS: 1.0000 | ORAL_TABLET | Freq: Every evening | ORAL | Status: DC | PRN

## 2014-09-20 MED ORDER — HYDROMORPHONE HCL 1 MG/ML IJ SOLN
1.0000 mg | INTRAMUSCULAR | Status: DC | PRN
Start: 2014-09-20 — End: 2014-09-22
  Administered 2014-09-20 – 2014-09-21 (×2): 1 mg via INTRAVENOUS
  Filled 2014-09-20 (×3): qty 1

## 2014-09-20 MED ORDER — ONDANSETRON HCL 4 MG PO TABS: 4.0000 mg | ORAL_TABLET | Freq: Four times a day (QID) | ORAL | Status: DC | PRN

## 2014-09-20 MED ORDER — ONDANSETRON HCL 4 MG/2ML IJ SOLN: 4.0000 mg | Freq: Four times a day (QID) | INTRAMUSCULAR | Status: DC | PRN

## 2014-09-20 MED ORDER — GADOBENATE DIMEGLUMINE 529 MG/ML IV SOLN
20.0000 mL | Freq: Once | INTRAVENOUS | Status: AC | PRN
  Administered 2014-09-20: 20 mL via INTRAVENOUS

## 2014-09-20 MED ORDER — DOCUSATE SODIUM 100 MG PO CAPS
100.0000 mg | ORAL_CAPSULE | Freq: Two times a day (BID) | ORAL | Status: DC
  Administered 2014-09-21 – 2014-09-22 (×3): 100 mg via ORAL
  Filled 2014-09-20 (×5): qty 1

## 2014-09-20 MED ORDER — LACTATED RINGERS IV SOLN
INTRAVENOUS | Status: DC
  Administered 2014-09-20: 85 mL/h via INTRAVENOUS
  Administered 2014-09-21: 10:00:00 via INTRAVENOUS

## 2014-09-20 NOTE — H&P (Signed)
History of Present Illness The patient is a 48 year old male presenting for a post-operative visit. Patient is 4 months postop following total disk arthroplasty (on 05/19/14).Overall the patient feels that they are worse. Post operative pain has been severe. The patient does report pain (stood up last night and back felt it caught, he could not straighten up or walk), while the patient does not report wound dehiscence, drainage, fever or chills The patient does indicate that these symptoms are improving. Pain medications include: Percocet (10-325 mg, Flexeril 10 mg, Ibuprofen 800 mg) and Advil . The patient is currently non-weightbearing with the assistance of: wheelchair. The patient is not using a brace. Note for "Post op": The patient states he was having bilat thigh pain yesterday, but today it is mostly on the left.   The patient presents for an acute increase in his pain. Over the weekend he was just doing some light housework with just lifting some very light boxes. He went to church on Sunday and that night he had horrific back pain. He contacted my office and he and his wife were unsure if they could get to my office and they wanted to go straight to the emergency room. Fortunately, they were able to get into my office.   Allergies Mickel Duhamel(Robin C Young; 09/20/2014 10:32 AM) Benadryl Allergy *ANTIHISTAMINES*  Social History  Tobacco use Never smoker. never smoker  Medication History Percocet (10-325MG  Tablet, Oral) Active. Atorvastatin Calcium (40MG  Tablet, Oral) Active. Losartan Potassium (50MG  Tablet, Oral) Active. Omeprazole (20MG  Capsule DR, Oral) Active. Sertraline HCl (100MG  Tablet, Oral) Active. SUMAtriptan Succinate (100MG  Tablet, Oral) Active. Testosterone Cypionate (100MG /ML Solution, Intramuscular) Active. Zolpidem Tartrate (10MG  Tablet, Oral) Active. Cyclobenzaprine HCl (10MG  Tablet, Oral) Active. Medications Reconciled  Other  Problems Hypercholesterolemia Migraine Headache Chronic Pain High blood pressure    Objective Transcription  Clinically after talking Oxycodone and Flexeril he has been able to stand on his own. He has horrific low back pain. Radiation into the left lower extremity. Negative straight leg raise test. No weakness in the lower extremity. Sensation intact. No abdominal tenderness or swelling. No bright red blood per rectum. No hematuria.  RADIOGRAPHS: X rays do not show any significant collapse of the disc arthroplasty. It does not appear that the plastic core has migrated out and it does not appear that he has a fracture.  I am going to get a stat CT scan just to confirm that the poly core is properly positioned and there is no other issues.   Patient seen in office this AM and sent for urgent CT scan Based on results and patients pain level elected to admit for pain control and MRI MRI reviewed - agree with radiologist interpretation Will plan on transforaminal ESI in AM and therapy No need for surgical management given lack of neural compression

## 2014-09-21 ENCOUNTER — Inpatient Hospital Stay (HOSPITAL_COMMUNITY): Payer: Managed Care, Other (non HMO)

## 2014-09-21 DIAGNOSIS — G8918 Other acute postprocedural pain: Secondary | ICD-10-CM | POA: Diagnosis not present

## 2014-09-21 LAB — CBC
HCT: 38.3 % — ABNORMAL LOW (ref 39.0–52.0)
Hemoglobin: 12.7 g/dL — ABNORMAL LOW (ref 13.0–17.0)
MCH: 28.6 pg (ref 26.0–34.0)
MCHC: 33.2 g/dL (ref 30.0–36.0)
MCV: 86.3 fL (ref 78.0–100.0)
PLATELETS: 213 10*3/uL (ref 150–400)
RBC: 4.44 MIL/uL (ref 4.22–5.81)
RDW: 13.7 % (ref 11.5–15.5)
WBC: 6.3 10*3/uL (ref 4.0–10.5)

## 2014-09-21 LAB — BASIC METABOLIC PANEL
Anion gap: 11 (ref 5–15)
BUN: 17 mg/dL (ref 6–23)
CO2: 29 mEq/L (ref 19–32)
CREATININE: 1.32 mg/dL (ref 0.50–1.35)
Calcium: 9.1 mg/dL (ref 8.4–10.5)
Chloride: 104 mEq/L (ref 96–112)
GFR calc non Af Amer: 63 mL/min — ABNORMAL LOW (ref >90)
GFR, EST AFRICAN AMERICAN: 73 mL/min — AB (ref >90)
Glucose, Bld: 104 mg/dL — ABNORMAL HIGH (ref 70–99)
POTASSIUM: 4.2 meq/L (ref 3.7–5.3)
SODIUM: 144 meq/L (ref 137–147)

## 2014-09-21 MED ORDER — METHYLPREDNISOLONE ACETATE 40 MG/ML IJ SUSP
INTRAMUSCULAR | Status: AC
Start: 2014-09-21 — End: 2014-09-22
  Filled 2014-09-21: qty 5

## 2014-09-21 MED ORDER — SODIUM CHLORIDE 0.9 % IJ SOLN
INTRAMUSCULAR | Status: AC
  Filled 2014-09-21: qty 10

## 2014-09-21 MED ORDER — LIDOCAINE HCL (PF) 1 % IJ SOLN
INTRAMUSCULAR | Status: AC
  Filled 2014-09-21: qty 5

## 2014-09-21 MED ORDER — IOHEXOL 180 MG/ML  SOLN
10.0000 mL | Freq: Once | INTRAMUSCULAR | Status: AC | PRN
  Administered 2014-09-21: 1 mL via INTRAVENOUS

## 2014-09-21 MED ORDER — METHYLPREDNISOLONE ACETATE 80 MG/ML IJ SUSP
INTRAMUSCULAR | Status: AC
  Filled 2014-09-21: qty 1

## 2014-09-21 NOTE — Procedures (Signed)
L L4/5 TF epidural steroid injection and nerve root block  Inj- 120 depomedrol and 2 cc 1% lidocaine.  No comp

## 2014-09-21 NOTE — Progress Notes (Signed)
Subjective: Doing ok.  Waiting to go down for lumbar ESI.  No leg pain.    Objective: Vital signs in last 24 hours: Temp:  [97.9 F (36.6 C)-98.4 F (36.9 C)] 98.2 F (36.8 C) (10/06 1232) Pulse Rate:  [64-72] 66 (10/06 1232) Resp:  [16-20] 20 (10/06 1232) BP: (101-122)/(63-74) 108/74 mmHg (10/06 1232) SpO2:  [94 %-100 %] 95 % (10/06 1232)  Intake/Output from previous day: 10/05 0701 - 10/06 0700 In: 756.5 [I.V.:756.5] Out: -  Intake/Output this shift:     Recent Labs  09/20/14 1813 09/21/14 0551  HGB 13.3 12.7*    Recent Labs  09/20/14 1813 09/21/14 0551  WBC 7.1 6.3  RBC 4.52 4.44  HCT 39.2 38.3*  PLT 209 213    Recent Labs  09/20/14 1813 09/21/14 0551  NA 139 144  K 4.4 4.2  CL 102 104  CO2 26 29  BUN 16 17  CREATININE 1.30 1.32  GLUCOSE 107* 104*  CALCIUM 9.3 9.1    Recent Labs  09/20/14 1813  INR 1.01    Exam:  Neurologically intact.  Alert and oriented.    Assessment/Plan: ESI today.  Possible d/c home today or tomorrow.  Will need to work with PT after injection.     OWENS,JAMES M 09/21/2014, 1:03 PM

## 2014-09-21 NOTE — Progress Notes (Signed)
Pt denies any discomfort since arrival to the unit from IR procedure. Pt report able to void since procedure. Reported off to incoming RN. Arabella MerlesP. Amo Tiffny Gemmer RN.

## 2014-09-22 DIAGNOSIS — G8918 Other acute postprocedural pain: Secondary | ICD-10-CM | POA: Diagnosis not present

## 2014-09-22 NOTE — Progress Notes (Signed)
Subjective: Doing much better after injection.  Ready to go home.    Objective: Vital signs in last 24 hours: Temp:  [98.4 F (36.9 C)-98.7 F (37.1 C)] 98.7 F (37.1 C) (10/07 1214) Pulse Rate:  [72-90] 90 (10/07 1214) Resp:  [14-19] 14 (10/07 1214) BP: (119-132)/(78-89) 124/78 mmHg (10/07 1214) SpO2:  [93 %-96 %] 96 % (10/07 1214)  Intake/Output from previous day: 10/06 0701 - 10/07 0700 In: 1175 [P.O.:240; I.V.:935] Out: 1600 [Urine:1600] Intake/Output this shift: Total I/O In: 480 [P.O.:480] Out: -    Recent Labs  09/20/14 1813 09/21/14 0551  HGB 13.3 12.7*    Recent Labs  09/20/14 1813 09/21/14 0551  WBC 7.1 6.3  RBC 4.52 4.44  HCT 39.2 38.3*  PLT 209 213    Recent Labs  09/20/14 1813 09/21/14 0551  NA 139 144  K 4.4 4.2  CL 102 104  CO2 26 29  BUN 16 17  CREATININE 1.30 1.32  GLUCOSE 107* 104*  CALCIUM 9.3 9.1    Recent Labs  09/20/14 1813  INR 1.01    Exam:  Alert and oriented.  Neurologically intact.   Assessment/Plan: D/c home today.  F/u in office 4 weeks.     Cory Holland M 09/22/2014, 4:38 PM

## 2014-09-22 NOTE — Evaluation (Signed)
Physical Therapy Evaluation Patient Details Name: Cory LewandowskyDennis Holland MRN: [id number] DOB: 1966/01/08 Today's Date: 09/22/2014   History of Present Illness  pt presents with L4-5 Steriod Injection.    Clinical Impression  Pt moving great and indicates this is better than he has been for the past couple days.  Pt ed on LE ther ex and core isometrics.  Advised pt to continue back precautions and lifting restrictions until cleared by MD.  Pt would benefit from OPPT for core strengthening and work hardening once cleared by MD.  No further acute care PT needs at this time.  Will sign off.      Follow Up Recommendations Outpatient PT    Equipment Recommendations  None recommended by PT    Recommendations for Other Services       Precautions / Restrictions Precautions Precautions: Back Precaution Comments: pt has Precautions paper from previous admit and was able to verbalize back precautions.   Restrictions Weight Bearing Restrictions: No      Mobility  Bed Mobility Overal bed mobility: Modified Independent                Transfers Overall transfer level: Modified independent Equipment used: None                Ambulation/Gait Ambulation/Gait assistance: Modified independent (Device/Increase time) Ambulation Distance (Feet): 500 Feet Assistive device: None Gait Pattern/deviations: Step-through pattern;Decreased stride length     General Gait Details: pt moves more slowly, but demos good balance.  No deficits.    Stairs Stairs: Yes Stairs assistance: Modified independent (Device/Increase time) Stair Management: One rail Left;Alternating pattern;Forwards Number of Stairs: 12 General stair comments: pt demos good safety on stairs.    Wheelchair Mobility    Modified Rankin (Stroke Patients Only)       Balance Overall balance assessment: No apparent balance deficits (not formally assessed)                                            Pertinent Vitals/Pain Pain Assessment: 0-10 Pain Score: 3  Pain Location: /back Pain Descriptors / Indicators: Aching Pain Intervention(s): Premedicated before session;Repositioned    Home Living Family/patient expects to be discharged to:: Private residence Living Arrangements: Spouse/significant other Available Help at Discharge: Family;Available 24 hours/day Type of Home: House Home Access: Stairs to enter Entrance Stairs-Rails: Left Entrance Stairs-Number of Steps: 5 Home Layout: One level Home Equipment: Walker - 2 wheels;Bedside commode;Tub bench;Hand held shower head      Prior Function Level of Independence: Independent               Hand Dominance   Dominant Hand: Right    Extremity/Trunk Assessment   Upper Extremity Assessment: Overall WFL for tasks assessed           Lower Extremity Assessment: Overall WFL for tasks assessed      Cervical / Trunk Assessment: Normal  Communication   Communication: No difficulties  Cognition Arousal/Alertness: Awake/alert Behavior During Therapy: WFL for tasks assessed/performed Overall Cognitive Status: Within Functional Limits for tasks assessed                      General Comments      Exercises        Assessment/Plan    PT Assessment Patent does not need any further PT services  PT Diagnosis Difficulty walking  PT Problem List    PT Treatment Interventions     PT Goals (Current goals can be found in the Care Plan section) Acute Rehab PT Goals PT Goal Formulation: No goals set, d/c therapy    Frequency     Barriers to discharge        Co-evaluation               End of Session   Activity Tolerance: Patient tolerated treatment well Patient left: in chair;with call bell/phone within reach;with family/visitor present Nurse Communication: Mobility status         Time: 4132-4401 PT Time Calculation (min): 18 min   Charges:   PT Evaluation $Initial PT Evaluation  Tier I: 1 Procedure PT Treatments $Gait Training: 8-22 mins   PT G CodesSunny Schlein, [place] [phone] 09/22/2014, 9:01 AM

## 2014-09-22 NOTE — Progress Notes (Signed)
Utilization review completed.  

## 2014-09-30 NOTE — Discharge Summary (Signed)
Patient ID: Cory LewandowskyDennis Holland MRN: [id number] DOB/AGE: Nov 21, 1966 48 y.o.  Admit date: 09/20/2014 Discharge date: 09/30/2014  Admission Diagnoses:  Active Problems:   Back pain   Discharge Diagnoses:  Active Problems:   Back pain  status post   Past Medical History  Diagnosis Date  . Shortness of breath     with exertion  . GERD (gastroesophageal reflux disease)   . Headache(784.0)     migraines  . Hypertension   . DJD (degenerative joint disease)     Surgeries:  on * No surgery found *   Consultants:    Discharged Condition: Improved  Hospital Course: Cory LewandowskyDennis Kluck is an 48 y.o. male who was admitted 09/20/2014 for operative treatment of low back pain. Patient failed conservative treatments (please see the history and physical for the specifics) and had severe unremitting pain that affects sleep, daily activities and work/hobbies. After pre-op clearance, the patient was taken to the operating room on * No surgery found * and underwent  .    Patient was given perioperative antibiotics:  Anti-infectives   None       Patient was given sequential compression devices and early ambulation to prevent DVT.   Patient benefited maximally from hospital stay and there were no complications. At the time of discharge, the patient was urinating/moving their bowels without difficulty, tolerating a regular diet, pain is controlled with oral pain medications and they have been cleared by PT/OT.   Recent vital signs: No data found.    Recent laboratory studies: No results found for this basename: WBC, HGB, HCT, PLT, NA, K, CL, CO2, BUN, CREATININE, GLUCOSE, PT, INR, CALCIUM, 2,  in the last 72 hours   Discharge Medications:     Medication List         atorvastatin 40 MG tablet  Commonly known as:  LIPITOR  Take 40 mg by mouth daily.     cyclobenzaprine 10 MG tablet  Commonly known as:  FLEXERIL  Take 10 mg by mouth 3 (three) times daily as needed for muscle spasms.     losartan 50 MG tablet  Commonly known as:  COZAAR  Take 50 mg by mouth daily.     omeprazole 20 MG capsule  Commonly known as:  PRILOSEC  Take 20 mg by mouth 2 (two) times daily as needed (for acid reflux).     oxyCODONE-acetaminophen 10-325 MG per tablet  Commonly known as:  PERCOCET  Take 1 tablet by mouth every 4 (four) hours as needed for pain.     sertraline 100 MG tablet  Commonly known as:  ZOLOFT  Take 100 mg by mouth daily.     SUMAtriptan 100 MG tablet  Commonly known as:  IMITREX  Take 100 mg by mouth every 2 (two) hours as needed for migraine or headache. May repeat in 2 hours if headache persists or recurs.     testosterone cypionate 200 MG/ML injection  Commonly known as:  DEPOTESTOTERONE CYPIONATE  Inject 200 mg into the muscle every 28 (twenty-eight) days.     zolpidem 10 MG tablet  Commonly known as:  AMBIEN  Take 10 mg by mouth at bedtime as needed for sleep.        Diagnostic Studies: Ct Lumbar Spine Wo Contrast  09/20/2014   ADDENDUM REPORT: 09/20/2014 14:14  ADDENDUM: Discussed with Dr. Shon BatonBrooks.  With additional review, the central disc protrusion at L4-L5 does appear to exert mass effect upon the L5 roots bilaterally at the  lateral recesses.  This could potentially be evaluated further by MR imaging or CT myelography.  Beam hardening artifacts from the L5-S1 disc prosthesis are present limiting prosthetic assessment though the prosthesis appears to be grossly normally positioned at the L5-S1 disc space without protrusion.   Electronically Signed   By: Ulyses SouthwardMark  Boles M.D.   On: 09/20/2014 14:14   09/20/2014   CLINICAL DATA:  Onset of severe low back pain while standing yesterday, minimally better today, history of L5-S1 disc replacement in June 2015, no leg pain, barely able to stand and walk  EXAM: CT LUMBAR SPINE WITHOUT CONTRAST  TECHNIQUE: Multidetector CT imaging of the lumbar spine was performed without intravenous contrast administration. Multiplanar CT image  reconstructions were also generated.  COMPARISON:  Lumbar radiographs 05/20/2014, lumbar spine CT 01/22/2014  FINDINGS: No local retroperitoneal abnormalities identified.  Exam labeled with 5 lumbar vertebrae.  Beam hardening artifacts from disc prosthesis at L5-S1.  Disc space narrowing at L4-L5 greater on RIGHT.  Vertebral body and disc space heights otherwise maintained.  No acute fracture or subluxation.  T12-L1, L1-L2: No abnormalities.  L2-L3: Mild disc bulge. No disc herniation or neural compression. Foramina patent.  L3-L4: Mild disc bulge. No focal disc herniation or neural compression. Foramina patent.  L4-L5: Diffuse disc bulge with central disc protrusion indenting thecal sac. Foramina patent with no mass effect upon exiting L4 roots. Mild lateral recess stenosis without definite mass effect upon L5 roots , though these are in close proximity to the disc herniation.  L5-S1: No focal disc herniation or neural compression. Foramina patent.  IMPRESSION: Disc prosthesis at L5-S1 without disc herniation or neural compression.  Disc space narrowing at L4-L5 with a large central disc protrusion indenting thecal sac, in proximity to the L5 roots entering the lateral recesses without definite neural compression.  Clinical correlation for symptoms in the L5 distributions recommended however.  Electronically Signed: By: Ulyses SouthwardMark  Boles M.D. On: 09/20/2014 12:56   Mr Lumbar Spine W Wo Contrast  09/20/2014   CLINICAL DATA:  Acute onset of low back pain. Artificial disc inserted on 05/19/2014 and L5-S1.  EXAM: MRI LUMBAR SPINE WITHOUT AND WITH CONTRAST  TECHNIQUE: Multiplanar and multiecho pulse sequences of the lumbar spine were obtained without and with intravenous contrast.  CONTRAST:  20mL MULTIHANCE GADOBENATE DIMEGLUMINE 529 MG/ML IV SOLN  COMPARISON:  CT scan dated 09/20/2014 and radiographs dated 05/20/2014  FINDINGS: Normal conus tip at T12-L1.  Normal paraspinal soft tissues.  T11-12 through L3-4:  Normal.   L4-5: Small broad-based soft disc protrusion which slightly compresses the anterior aspect of the thecal sac without focal neural impingement. No foraminal stenosis. No facet arthritis.  L5-S1: Artificial disc in place. No foraminal stenosis. Detail is distorted by the metal artifact. However, there is no neural impingement. No facet arthritis.  No pathologic enhancement after contrast administration.  IMPRESSION: 1. No acute abnormalities of the lumbar spine. 2. Small broad-based soft disc protrusion at L4-5 slightly compressing the ventral aspect of the thecal sac without focal neural impingement. 3. No complicating features at the level of the artificial disc at L5-S1.   Electronically Signed   By: Geanie CooleyJim  Maxwell M.D.   On: 09/20/2014 17:15   Ir Epidurography  09/21/2014   CLINICAL DATA:  Lumbosacral spondylosis without myelopathy. L4-5 disc herniation. Low back pain left greater than right without sciatic of.  EXAM: EPIDUROGRAPHY  FLUOROSCOPY TIME:  1 min and 6 seconds.  PROCEDURE: The procedure, risks, benefits, and alternatives were explained  to the patient. Questions regarding the procedure were encouraged and answered. The patient understands and consents to the procedure.  Left L4 NERVE ROOT BLOCK AND TRANSFORAMINAL EPIDURAL: A posterior oblique approach was taken to the intervertebral foramen on the left at L4-5 using a curved 22 gauge spinal needle. Injection of Omnipaque 180 outlined the left L4 nerve root and showed good epidural spread. No vascular opacification is seen. One hundred twenty mg of Depo-Medrol mixed with 2 cc 1% lidocaine were instilled. The procedure was well-tolerated, and the patient was discharged thirty minutes following the injection in good condition.  COMPLICATIONS: None  IMPRESSION: Technically successful injection consisting of a left L4 nerve root block and transforaminal epidural.   Electronically Signed   By: Maryclare Bean M.D.   On: 09/21/2014 16:20        Discharge  Instructions   Call MD / Call 911    Complete by:  As directed   If you experience chest pain or shortness of breath, CALL 911 and be transported to the hospital emergency room.  If you develope a fever above 101 F, pus (white drainage) or increased drainage or redness at the wound, or calf pain, call your surgeon's office.     Constipation Prevention    Complete by:  As directed   Drink plenty of fluids.  Prune juice may be helpful.  You may use a stool softener, such as Colace (over the counter) 100 mg twice a day.  Use MiraLax (over the counter) for constipation as needed.     Diet - low sodium heart healthy    Complete by:  As directed      Discharge instructions    Complete by:  As directed   Ok to shower, .  No aggressive activity.  No bending, squatting or prolonged sitting.  Mostly be in reclined position or lying down.     Driving restrictions    Complete by:  As directed   No driving for one week.     Increase activity slowly as tolerated    Complete by:  As directed      Lifting restrictions    Complete by:  As directed   No lifting until further notice.     Vital signs    Complete by:  As directed   Upon arrival to nursing area and as patient's condition warrants           Follow-up Information   Follow up with Alvy Beal, MD. (need return office visit 4 weeks.  call in one week to let us know how your are doing. )    Specialty:  Orthopedic Surgery   Contact information:   [address] [place] [address] [phone]       Discharge Plan:  discharge to home  Disposition:     Signed: Naida Sleight for Dr. Venita Lick [facility] [phone] 09/30/2014, 10:13 AM

## 2014-10-04 NOTE — Discharge Summary (Signed)
Agree with above 

## 2015-11-27 IMAGING — CT CT L SPINE W/O CM
4 of 11 series · 12 of 33 positions shown, 14 images · non-contrast
Comparison: Lumbar radiographs 05/20/2014, lumbar spine CT
01/22/2014

ADDENDUM:
Discussed with Dr. Ylander.

With additional review, the central disc protrusion at L4-L5 does
appear to exert mass effect upon the L5 roots bilaterally at the
lateral recesses.
This could potentially be evaluated further by MR imaging or CT
myelography.
Beam hardening artifacts from the L5-S1 disc prosthesis are present
limiting prosthetic assessment though the prosthesis appears to be
grossly normally positioned at the L5-S1 disc space without
protrusion.
CLINICAL DATA: Onset of severe low back pain while standing
yesterday, minimally better today, history of L5-S1 disc replacement
in May 2014, no leg pain, barely able to stand and walk
EXAM:
CT LUMBAR SPINE WITHOUT CONTRAST
TECHNIQUE: Multidetector CT imaging of the lumbar spine was performed without
intravenous contrast administration. Multiplanar CT image
reconstructions were also generated.

[Series 2: l spine bone · axial · 0.27mm/px · z∈[-386,-140]mm · 3 of 99 slices shown, 4 images]
[im 1/99  soft-tissue]
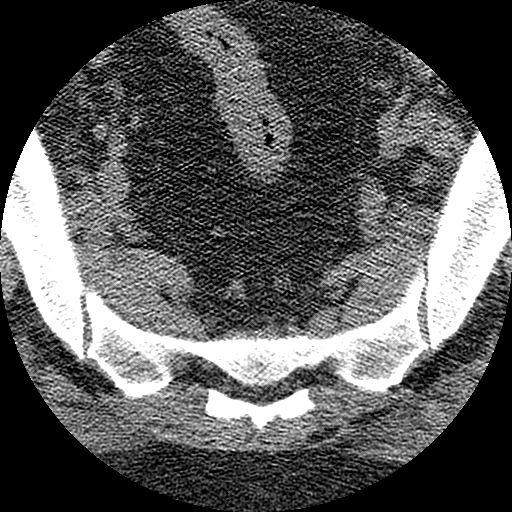
[im 1/99  bone]
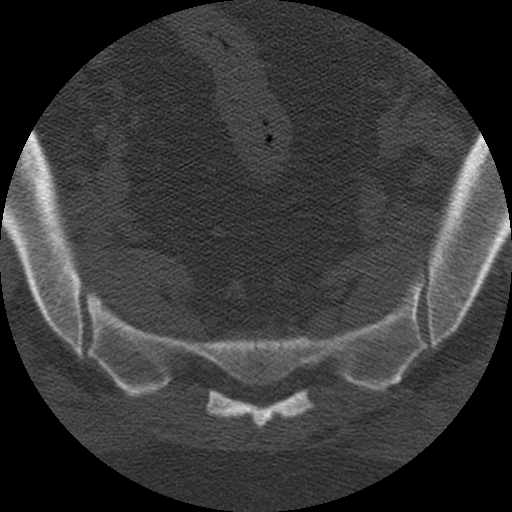
[im 50/99  bone]
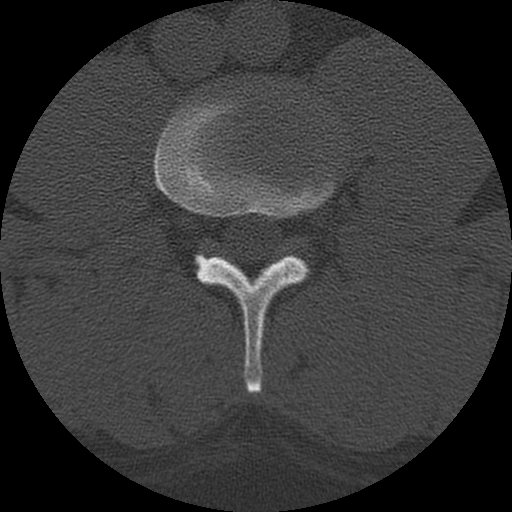
[im 99/99  bone]
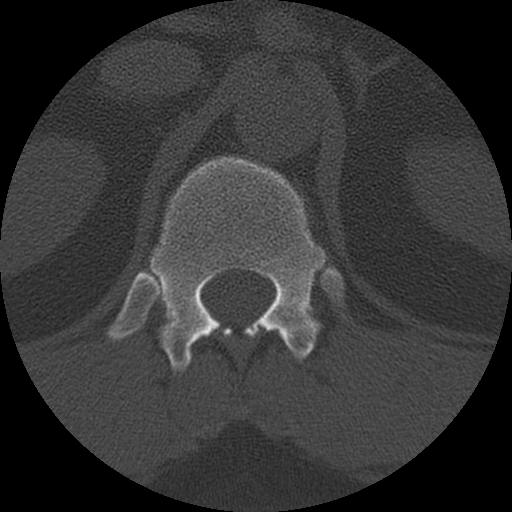

[Series 3: l spine soft · axial · 0.27mm/px · z∈[-306,-223]mm · 2 of 99 slices shown]
[im 33/99  soft-tissue]
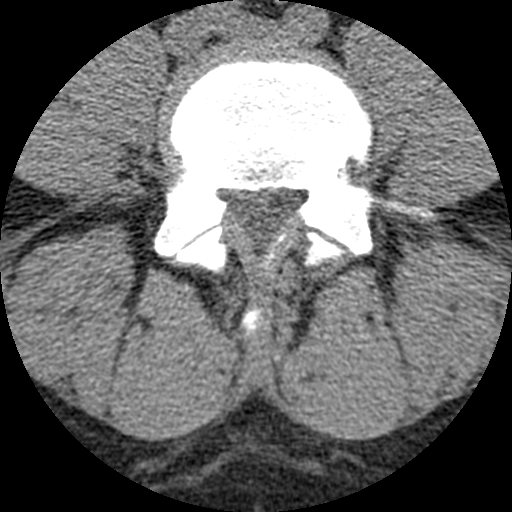
[im 66/99  soft-tissue]
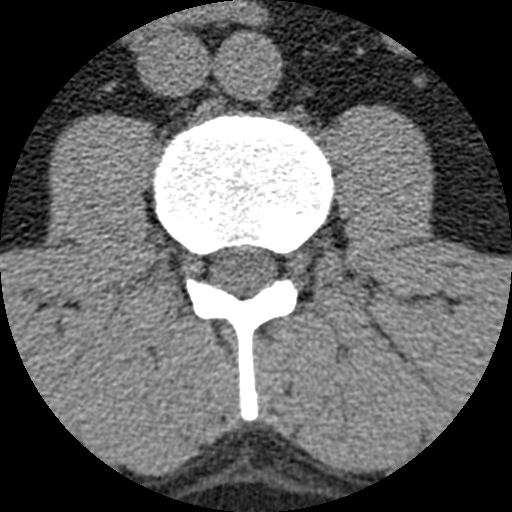

[Series 401: cor lower · coronal · 0.49mm/px · 2 of 53 slices shown]
[im 2/53  bone]
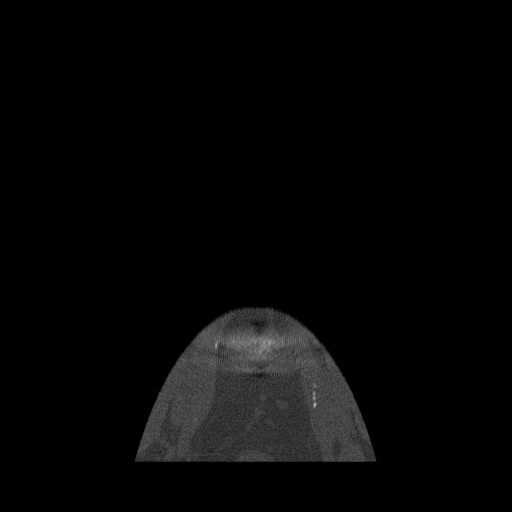
[im 27/53  bone]
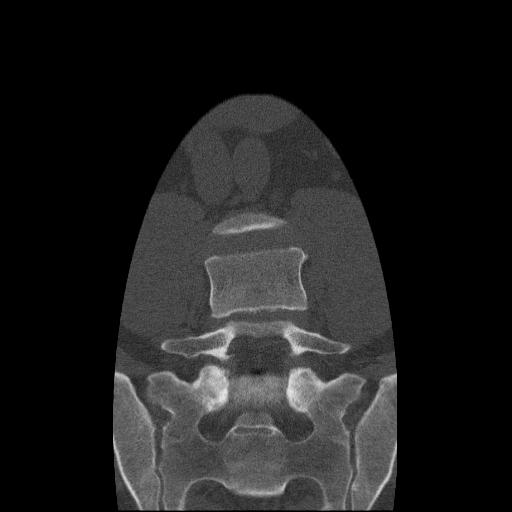

[Series 402: sag · sagittal · 0.49mm/px · 5 of 53 slices shown, 6 images]
[im 18/53  bone]
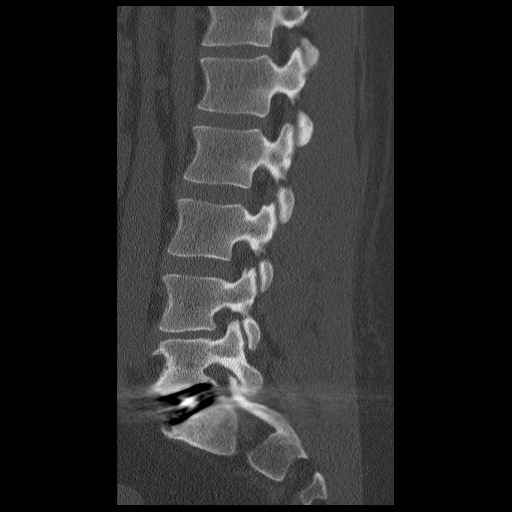
[im 22/53  bone]
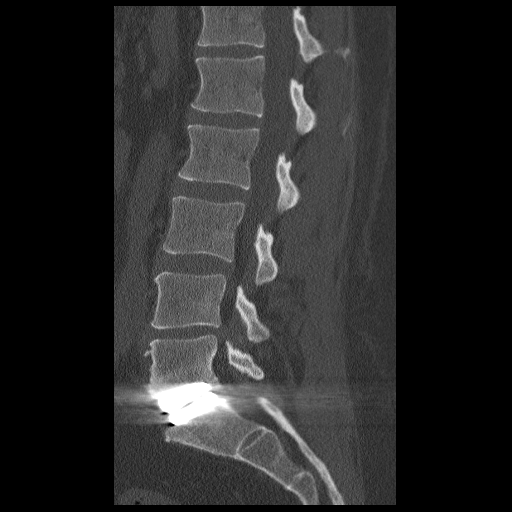
[im 27/53  soft-tissue]
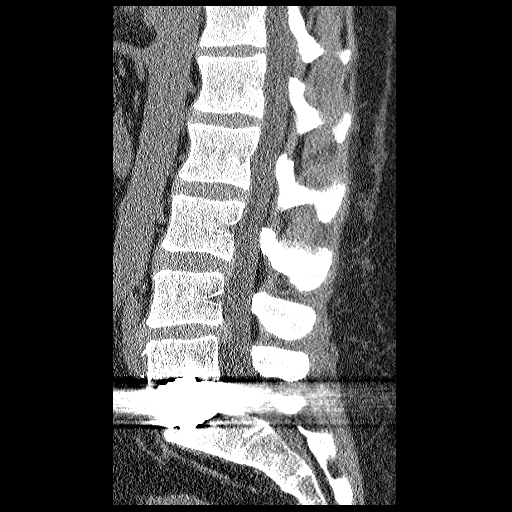
[im 27/53  bone]
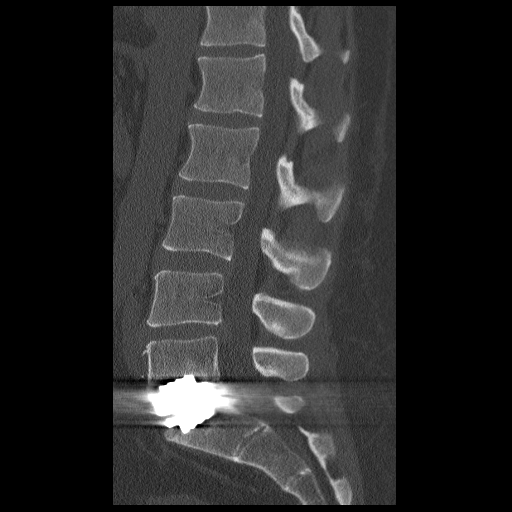
[im 31/53  bone]
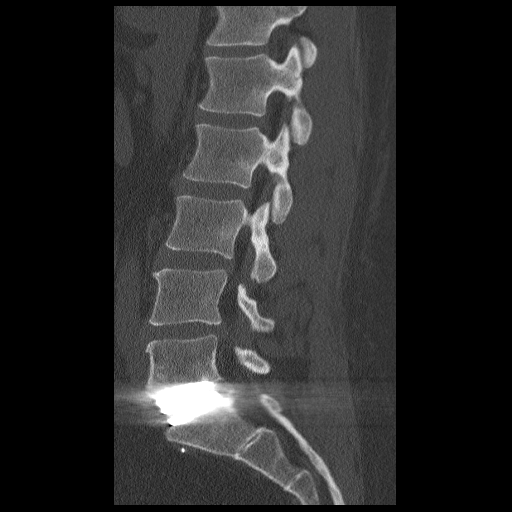
[im 35/53  bone]
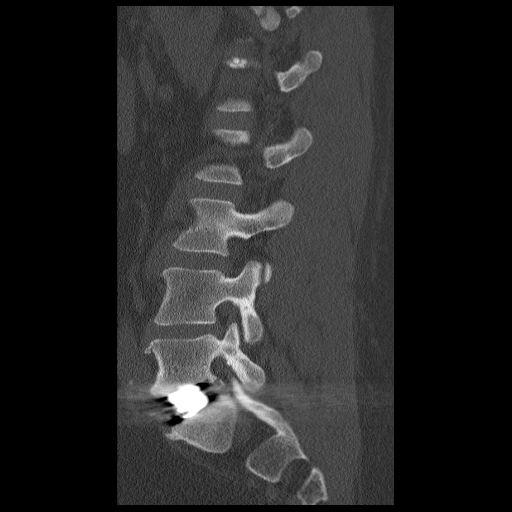

[12 of 33 positions shown; findings below may reference images not displayed]

FINDINGS: No local retroperitoneal abnormalities identified.

Exam labeled with 5 lumbar vertebrae.

Beam hardening artifacts from disc prosthesis at L5-S1.

Disc space narrowing at L4-L5 greater on RIGHT.

Vertebral body and disc space heights otherwise maintained.

No acute fracture or subluxation.

T12-L1, L1-L2: No abnormalities.

L2-L3: Mild disc bulge. No disc herniation or neural compression.
Foramina patent.

L3-L4: Mild disc bulge. No focal disc herniation or neural
compression. Foramina patent.

L4-L5: Diffuse disc bulge with central disc protrusion indenting
thecal sac. Foramina patent with no mass effect upon exiting L4
roots. Mild lateral recess stenosis without definite mass effect
upon L5 roots , though these are in close proximity to the disc
herniation.

L5-S1: No focal disc herniation or neural compression. Foramina
patent.
IMPRESSION: Disc prosthesis at L5-S1 without disc herniation or neural
compression.

Disc space narrowing at L4-L5 with a large central disc protrusion
indenting thecal sac, in proximity to the L5 roots entering the
lateral recesses without definite neural compression.

Clinical correlation for symptoms in the L5 distributions
recommended however.

## 2018-03-21 NOTE — Pre-Procedure Instructions (Signed)
Cory Holland [facility]  03/21/2018      [facility] - [place], [place] - [address] [place] [address] Phone: [phone] Fax: [phone]    Your procedure is scheduled on Thursday April 18.  Report to [facility] Admitting at 5:30 A.M.  Call this number if you have problems the morning of surgery:  [phone]   Remember:  Do not eat food or drink liquids after midnight.  Take these medicines the morning of surgery with A SIP OF WATER:   Omeprazole (prilosec) if needed Ranitidine (Zantac) if needed  DO NOT TAKE Metformin (Glucophage) the day of surgery  DO NOT TAKE any pseudoephedrine starting now  7 days prior to surgery STOP taking any Aspirin(unless otherwise instructed by your surgeon), Aleve, Naproxen, Ibuprofen, Motrin, Advil, Goody's, BC's, all herbal medications, fish oil, and all vitamins     How to Manage Your Diabetes Before and After Surgery  Why is it important to control my blood sugar before and after surgery? . Improving blood sugar levels before and after surgery helps healing and can limit problems. . A way of improving blood sugar control is eating a healthy diet by: o  Eating less sugar and carbohydrates o  Increasing activity/exercise o  Talking with your doctor about reaching your blood sugar goals . High blood sugars (greater than 180 mg/dL) can raise your risk of infections and slow your recovery, so you will need to focus on controlling your diabetes during the weeks before surgery. . Make sure that the doctor who takes care of your diabetes knows about your planned surgery including the date and location.  How do I manage my blood sugar before surgery? . Check your blood sugar at least 4 times a day, starting 2 days before surgery, to make sure that the level is not too high or low. o Check your blood sugar the morning of your surgery when you wake up and every 2 hours until you get to the Short Stay  unit. . If your blood sugar is less than 70 mg/dL, you will need to treat for low blood sugar: o Do not take insulin. o Treat a low blood sugar (less than 70 mg/dL) with  cup of clear juice (cranberry or apple), 4 glucose tablets, OR glucose gel. Recheck blood sugar in 15 minutes after treatment (to make sure it is greater than 70 mg/dL). If your blood sugar is not greater than 70 mg/dL on recheck, call [phone] o  for further instructions. . Report your blood sugar to the short stay nurse when you get to Short Stay.  . If you are admitted to the hospital after surgery: o Your blood sugar will be checked by the staff and you will probably be given insulin after surgery (instead of oral diabetes medicines) to make sure you have good blood sugar levels. o The goal for blood sugar control after surgery is 80-180 mg/dL.              Do not wear jewelry, make-up or nail polish.  Do not wear lotions, powders, or perfumes, or deodorant.  Do not shave 48 hours prior to surgery.  Men may shave face and neck.  Do not bring valuables to the hospital.  [facility] is not responsible for any belongings or valuables.  Contacts, dentures or bridgework may not be worn into surgery.  Leave your suitcase in the car.  After surgery it  may be brought to your room.  For patients admitted to the hospital, discharge time will be determined by your treatment team.  Patients discharged the day of surgery will not be allowed to drive home.   Special instructions:    [facility]- Preparing For Surgery  Before surgery, you can play an important role. Because skin is not sterile, your skin needs to be as free of germs as possible. You can reduce the number of germs on your skin by washing with CHG (chlorahexidine gluconate) Soap before surgery.  CHG is an antiseptic cleaner which kills germs and bonds with the skin to continue killing germs even after washing.  Please do not use if you have an  allergy to CHG or antibacterial soaps. If your skin becomes reddened/irritated stop using the CHG.  Do not shave (including legs and underarms) for at least 48 hours prior to first CHG shower. It is OK to shave your face.  Please follow these instructions carefully.   1. Shower the NIGHT BEFORE SURGERY and the MORNING OF SURGERY with CHG.   2. If you chose to wash your hair, wash your hair first as usual with your normal shampoo.  3. After you shampoo, rinse your hair and body thoroughly to remove the shampoo.  4. Use CHG as you would any other liquid soap. You can apply CHG directly to the skin and wash gently with a scrungie or a clean washcloth.   5. Apply the CHG Soap to your body ONLY FROM THE NECK DOWN.  Do not use on open wounds or open sores. Avoid contact with your eyes, ears, mouth and genitals (private parts). Wash Face and genitals (private parts)  with your normal soap.  6. Wash thoroughly, paying special attention to the area where your surgery will be performed.  7. Thoroughly rinse your body with warm water from the neck down.  8. DO NOT shower/wash with your normal soap after using and rinsing off the CHG Soap.  9. Pat yourself dry with a CLEAN TOWEL.  10. Wear CLEAN PAJAMAS to bed the night before surgery, wear comfortable clothes the morning of surgery  11. Place CLEAN SHEETS on your bed the night of your first shower and DO NOT SLEEP WITH PETS.    Day of Surgery: Do not apply any deodorants/lotions. Please wear clean clothes to the hospital/surgery center.      Please read over the following fact sheets that you were given. Coughing and Deep Breathing, MRSA Information and Surgical Site Infection Prevention

## 2018-03-24 ENCOUNTER — Encounter (HOSPITAL_COMMUNITY): Payer: Self-pay

## 2018-03-24 ENCOUNTER — Other Ambulatory Visit: Payer: Self-pay

## 2018-03-24 ENCOUNTER — Encounter (HOSPITAL_COMMUNITY)
Admission: RE | Admit: 2018-03-24 | Discharge: 2018-03-24 | Disposition: A | Discharge status: Home / Self Care | Payer: BLUE CROSS/BLUE SHIELD | Source: Ambulatory Visit | Attending: Orthopedic Surgery | Admitting: Orthopedic Surgery

## 2018-03-24 DIAGNOSIS — Z01812 Encounter for preprocedural laboratory examination: Secondary | ICD-10-CM | POA: Insufficient documentation

## 2018-03-24 DIAGNOSIS — Z7984 Long term (current) use of oral hypoglycemic drugs: Secondary | ICD-10-CM | POA: Insufficient documentation

## 2018-03-24 DIAGNOSIS — Z79899 Other long term (current) drug therapy: Secondary | ICD-10-CM | POA: Diagnosis not present

## 2018-03-24 DIAGNOSIS — E119 Type 2 diabetes mellitus without complications: Secondary | ICD-10-CM | POA: Diagnosis not present

## 2018-03-24 DIAGNOSIS — K219 Gastro-esophageal reflux disease without esophagitis: Secondary | ICD-10-CM | POA: Diagnosis not present

## 2018-03-24 DIAGNOSIS — I10 Essential (primary) hypertension: Secondary | ICD-10-CM | POA: Diagnosis not present

## 2018-03-24 DIAGNOSIS — G4733 Obstructive sleep apnea (adult) (pediatric): Secondary | ICD-10-CM | POA: Diagnosis not present

## 2018-03-24 HISTORY — DX: Personal history of urinary calculi: Z87.442

## 2018-03-24 HISTORY — DX: Type 2 diabetes mellitus without complications: E11.9

## 2018-03-24 HISTORY — DX: Family history of other specified conditions: Z84.89

## 2018-03-24 HISTORY — DX: Sleep apnea, unspecified: G47.30

## 2018-03-24 LAB — BASIC METABOLIC PANEL
ANION GAP: 12 (ref 5–15)
BUN: 14 mg/dL (ref 6–20)
CALCIUM: 9.5 mg/dL (ref 8.9–10.3)
CO2: 23 mmol/L (ref 22–32)
Chloride: 102 mmol/L (ref 101–111)
Creatinine, Ser: 1.11 mg/dL (ref 0.61–1.24)
GFR calc Af Amer: >60 mL/min (ref >60)
GFR calc non Af Amer: >60 mL/min (ref >60)
GLUCOSE: 157 mg/dL — AB (ref 65–99)
Potassium: 4.2 mmol/L (ref 3.5–5.1)
Sodium: 137 mmol/L (ref 135–145)

## 2018-03-24 LAB — GLUCOSE, CAPILLARY: Glucose-Capillary: 161 mg/dL — ABNORMAL HIGH (ref 65–99)

## 2018-03-24 LAB — CBC
HEMATOCRIT: 41.7 % (ref 39.0–52.0)
Hemoglobin: 13.6 g/dL (ref 13.0–17.0)
MCH: 28.8 pg (ref 26.0–34.0)
MCHC: 32.6 g/dL (ref 30.0–36.0)
MCV: 88.3 fL (ref 78.0–100.0)
Platelets: 194 10*3/uL (ref 150–400)
RBC: 4.72 MIL/uL (ref 4.22–5.81)
RDW: 13.8 % (ref 11.5–15.5)
WBC: 7 10*3/uL (ref 4.0–10.5)

## 2018-03-24 LAB — SURGICAL PCR SCREEN
MRSA, PCR: NEGATIVE
Staphylococcus aureus: POSITIVE — AB

## 2018-03-24 NOTE — Progress Notes (Signed)
PCP - Bettye BoeckGina Snider- last office note and EKG requested Cardiologist - denies  EKG - requested Stress Test - [facility]. Pt repots normal stress test in last 5 years  Sleep Study - Requested from Dr. Nelva NayPoncharoensri CPAP - 5 pressure setting per patient  Fasting Blood Sugar - pt states he does not check his CBG often, normally 120-135 if he does, last A1c 6.8 in care everywhere 02/13/18  Anesthesia review: follow up cardiac records request. Order for anesthesia consult per Dr. Shon BatonBrooks.   Patient denies shortness of breath, fever, cough and chest pain at PAT appointment   Patient verbalized understanding of instructions that were given to them at the PAT appointment. Patient was also instructed that they will need to review over the PAT instructions again at home before surgery.

## 2018-03-24 NOTE — Progress Notes (Signed)
Nasal PCR positive for MSSA. Pt notified and prescription called to pharmacy.

## 2018-03-25 NOTE — Progress Notes (Signed)
Anesthesia Chart Review:  Pt is a 52 year old male scheduled for L4-5 decompression/ discectomy on 04/03/2018 with Venita Lickahari Brooks, MD  - PCP is Bettye BoeckGina Snider, PA. Pt cleared for surgery at last office visit 02/24/18 (notes in care everywhere)   PMH includes:  HTN, DM, OSA, GERD. Never smoker. BMI 35.5. S/p L5-S1 disc replacement 05/19/14  Medications include: Lipitor, losartan, metformin, Prilosec, Zantac  BP (!) 132/94   Pulse 81   Temp 36.6 C   Resp 20   Ht 5\' 8"  (1.727 m)   Wt 232 lb 14.4 oz (105.6 kg)   SpO2 95%   BMI 35.41 kg/m  - BP was 118/80 at PCP's office 02/24/18  Preoperative labs reviewed.   - glucose 157.  HbA1c was 6.8 on 02/13/18 at PCPs office  EKG 02/24/18 (PCPs office): Sinus rhythm.  RSR in V1 nondiagnostic  Pt reports he had a stress [facility] in last [facility], but they have no record of this.    Pt cleared by PCP for surgery. If no changes, I anticipate pt can proceed with surgery as scheduled.   Rica Mastngela Cass Vandermeulen, FNP-BC [facility]Anesthesiology Phone: [phone] 03/25/2018 3:21 PM

## 2018-03-31 NOTE — H&P (Addendum)
Patient ID: Cory Holland MRN: [id number] DOB/AGE: 51/01/1966 51 y.o.  Admit date: (Not on file)  Admission Diagnoses:  Lumabr 4-5 Disc Herniation  HPI: The pt presents for his pre-op H& P.  Pt is scheduled for  Lumbar 4/5 decompression and discectomy.  Pt has DM type 2.  Last A1c was 6.8. Pt has sleep apnea.  He will bring his Bipap mask.  Past Medical History: Past Medical History:  Diagnosis Date  . Diabetes mellitus without complication (HCC)   . DJD (degenerative joint disease)   . Family history of adverse reaction to anesthesia    "mom gets sick"  . GERD (gastroesophageal reflux disease)   . Headache(784.0)    migraines  . History of kidney stones   . Hypertension   . Shortness of breath    with exertion  . Sleep apnea    CPAP 5    Surgical History: Past Surgical History:  Procedure Laterality Date  . ABDOMINAL EXPOSURE N/A 05/19/2014   Procedure: ABDOMINAL EXPOSURE;  Surgeon: Larina Earthly, MD;  Location: [facility] OR;  Service: Vascular;  Laterality: N/A;  . CARPAL TUNNEL RELEASE Right   . fatty tumor Left    left side neck  . HERNIA REPAIR    . LUMBAR DISC ARTHROPLASTY N/A 05/19/2014   Procedure: TOTAL DISC REPLACEMENT L5-S1  (1 LEVEL);  Surgeon: Venita Lick, MD;  Location: [facility] OR;  Service: Orthopedics;  Laterality: N/A;  . PATELLAR TENDON REPAIR Right   . TONSILLECTOMY      Family History: No family history on file.  Social History: Social History   Socioeconomic History  . Marital status: Married    Spouse name: Not on file  . Number of children: Not on file  . Years of education: Not on file  . Highest education level: Not on file  Occupational History  . Not on file  Social Needs  . Financial resource strain: Not on file  . Food insecurity:    Worry: Not on file    Inability: Not on file  . Transportation needs:    Medical: Not on file    Non-medical: Not on file  Tobacco Use  . Smoking status: Never Smoker  . Smokeless tobacco: Never Used    Substance and Sexual Activity  . Alcohol use: No  . Drug use: No  . Sexual activity: Not on file  Lifestyle  . Physical activity:    Days per week: Not on file    Minutes per session: Not on file  . Stress: Not on file  Relationships  . Social connections:    Talks on phone: Not on file    Gets together: Not on file    Attends religious service: Not on file    Active member of club or organization: Not on file    Attends meetings of clubs or organizations: Not on file    Relationship status: Not on file  . Intimate partner violence:    Fear of current or ex partner: Not on file    Emotionally abused: Not on file    Physically abused: Not on file    Forced sexual activity: Not on file  Other Topics Concern  . Not on file  Social History Narrative  . Not on file    Allergies: Benadryl [diphenhydramine hcl (sleep)]  Medications: I have reviewed the patient's current medications.  Vital Signs: No data found.  Radiology: No results found.  Labs: No results for input(s): WBC, RBC,  HCT, PLT in the last 72 hours. No results for input(s): NA, K, CL, CO2, BUN, CREATININE, GLUCOSE, CALCIUM in the last 72 hours. No results for input(s): LABPT, INR in the last 72 hours.  Review of Systems: ROS  Physical Exam: There is no height or weight on file to calculate BMI.  Physical Exam  Constitutional: He is oriented to person, place, and time. He appears well-developed and well-nourished.  HENT:  Head: Normocephalic.  Eyes: Pupils are equal, round, and reactive to light.  Neck: Normal range of motion.  Cardiovascular: Normal rate, regular rhythm and normal heart sounds.  Respiratory: Effort normal and breath sounds normal.  GI: Soft. Bowel sounds are normal.  Neurological: He is alert and oriented to person, place, and time.  Skin: Skin is warm and dry.  Psychiatric: He has a normal mood and affect. His behavior is normal. Judgment and thought content normal.   Alert 3  no shortness of breath no chest pain abdomen is soft and nontender compartments are soft and nontender tenderness to palpation of the lumbar paraspinals left worse than right range of motion flexion extension lateral bending increases patient's pain.  No sensation deficits of the lower extremities patient does have difficulty walking on heels and toes no other motor deficits are noted.  The patient's lumbar MRI dated January 2019 is significant for a L4-5 superimposed moderate size central disc extrusion which does indent the anterior thecal sac does encroach on the L5 nerve root mild to moderate thickening of the ligamentum flavum bilateral neuroforaminal narrowing moderate on the right mild on the left  Assessment and Plan: Risks and benefits of surgery were discussed with the patient. These include: Infection, bleeding, death, stroke, paralysis, ongoing or worse pain, need for additional surgery, leak of spinal fluid, adjacent segment degeneration requiring additional surgery, post-operative hematoma formation that can result in neurological compromise and the need for urgent/emergent re-operation. Loss in bowel and bladder control. Injury to major vessels that could result in the need for urgent abdominal surgery to stop bleeding. Risk of deep venous thrombosis (DVT) and the need for additional treatment. Recurrent disc herniation resulting in the need for revision surgery, which could include fusion surgery (utilizing instrumentation such as pedicle screws and intervertebral cages).  Additional risk: If instrumentation is used to address spinal stenosis there is a risk of migration, or breakage of that hardware that could require additional surgery.  Goal of surgery: Reduce (not eliminate) pain, and improve quality of life.   Anette Riedelarmen Mayo, PAC for Venita Lickahari Neven Fina, MD [facility] [phone]  Patient continues to have positive left radicular leg pain.  Positive straight leg raise test.   Positive dysesthesias in the L5 distribution.  I have reviewed the surgical procedure as well as the risks and benefits with the patient and his wife again.  All of their questions were encouraged and addressed.  Plan on 23-hour admission or possible discharge later today depending upon how he is doing.

## 2018-04-02 NOTE — Anesthesia Preprocedure Evaluation (Addendum)
Anesthesia Evaluation  Patient identified by MRN, date of birth, ID band Patient awake    Reviewed: Allergy & Precautions, H&P , NPO status , Patient's Chart, lab work & pertinent test results  History of Anesthesia Complications (+) Family history of anesthesia reactionNegative for: history of anesthetic complications  Airway Mallampati: II  TM Distance: >3 FB Neck ROM: Full    Dental  (+) Teeth Intact, Dental Advisory Given   Pulmonary sleep apnea and Continuous Positive Airway Pressure Ventilation ,    Pulmonary exam normal        Cardiovascular hypertension, Pt. on medications Normal cardiovascular exam  EKG 02/24/18  Sinus rhythm.  RSR in V1 nondiagnostic      Neuro/Psych negative neurological ROS  negative psych ROS   GI/Hepatic Neg liver ROS, GERD  Medicated,  Endo/Other  diabetes, Type 2, Oral Hypoglycemic Agents  Renal/GU negative Renal ROS  negative genitourinary   Musculoskeletal  (+) Arthritis ,   Abdominal   Peds  Hematology   Anesthesia Other Findings   Reproductive/Obstetrics negative OB ROS                            Anesthesia Physical  Anesthesia Plan  ASA: II  Anesthesia Plan: General   Post-op Pain Management:    Induction: Intravenous  PONV Risk Score and Plan: 1 and Ondansetron and Treatment may vary due to age or medical condition  Airway Management Planned: Oral ETT  Additional Equipment:   Intra-op Plan:   Post-operative Plan: Extubation in OR  Informed Consent: I have reviewed the patients History and Physical, chart, labs and discussed the procedure including the risks, benefits and alternatives for the proposed anesthesia with the patient or authorized representative who has indicated his/her understanding and acceptance.   Dental advisory given  Plan Discussed with: CRNA, Anesthesiologist and Surgeon  Anesthesia Plan Comments:         Anesthesia Quick Evaluation

## 2018-04-03 ENCOUNTER — Ambulatory Visit (HOSPITAL_COMMUNITY): Payer: BLUE CROSS/BLUE SHIELD | Admitting: Anesthesiology

## 2018-04-03 ENCOUNTER — Encounter (HOSPITAL_COMMUNITY): Payer: Self-pay | Admitting: *Deleted

## 2018-04-03 ENCOUNTER — Encounter (HOSPITAL_COMMUNITY)
Admission: RE | Disposition: A | Discharge status: Home / Self Care | Payer: Self-pay | Source: Ambulatory Visit | Attending: Orthopedic Surgery

## 2018-04-03 ENCOUNTER — Ambulatory Visit (HOSPITAL_COMMUNITY): Payer: BLUE CROSS/BLUE SHIELD

## 2018-04-03 ENCOUNTER — Observation Stay (HOSPITAL_COMMUNITY)
Admission: RE | Admit: 2018-04-03 | Discharge: 2018-04-04 | Disposition: A | Discharge status: Home / Self Care | Payer: BLUE CROSS/BLUE SHIELD | Source: Ambulatory Visit | Attending: Orthopedic Surgery | Admitting: Orthopedic Surgery

## 2018-04-03 ENCOUNTER — Ambulatory Visit (HOSPITAL_COMMUNITY): Payer: BLUE CROSS/BLUE SHIELD | Admitting: Emergency Medicine

## 2018-04-03 DIAGNOSIS — Z7984 Long term (current) use of oral hypoglycemic drugs: Secondary | ICD-10-CM | POA: Insufficient documentation

## 2018-04-03 DIAGNOSIS — Z9689 Presence of other specified functional implants: Secondary | ICD-10-CM | POA: Diagnosis not present

## 2018-04-03 DIAGNOSIS — E119 Type 2 diabetes mellitus without complications: Secondary | ICD-10-CM | POA: Diagnosis not present

## 2018-04-03 DIAGNOSIS — K219 Gastro-esophageal reflux disease without esophagitis: Secondary | ICD-10-CM | POA: Diagnosis not present

## 2018-04-03 DIAGNOSIS — Z79899 Other long term (current) drug therapy: Secondary | ICD-10-CM | POA: Diagnosis not present

## 2018-04-03 DIAGNOSIS — G4730 Sleep apnea, unspecified: Secondary | ICD-10-CM | POA: Diagnosis not present

## 2018-04-03 DIAGNOSIS — I10 Essential (primary) hypertension: Secondary | ICD-10-CM | POA: Diagnosis not present

## 2018-04-03 DIAGNOSIS — M5116 Intervertebral disc disorders with radiculopathy, lumbar region: Principal | ICD-10-CM | POA: Insufficient documentation

## 2018-04-03 DIAGNOSIS — Z888 Allergy status to other drugs, medicaments and biological substances status: Secondary | ICD-10-CM | POA: Insufficient documentation

## 2018-04-03 DIAGNOSIS — G43909 Migraine, unspecified, not intractable, without status migrainosus: Secondary | ICD-10-CM | POA: Insufficient documentation

## 2018-04-03 DIAGNOSIS — Z87442 Personal history of urinary calculi: Secondary | ICD-10-CM | POA: Diagnosis not present

## 2018-04-03 DIAGNOSIS — Z9889 Other specified postprocedural states: Secondary | ICD-10-CM

## 2018-04-03 DIAGNOSIS — M5126 Other intervertebral disc displacement, lumbar region: Secondary | ICD-10-CM | POA: Diagnosis not present

## 2018-04-03 DIAGNOSIS — Z419 Encounter for procedure for purposes other than remedying health state, unspecified: Secondary | ICD-10-CM

## 2018-04-03 HISTORY — PX: LUMBAR LAMINECTOMY/DECOMPRESSION MICRODISCECTOMY: SHX5026

## 2018-04-03 LAB — GLUCOSE, CAPILLARY
GLUCOSE-CAPILLARY: 119 mg/dL — AB (ref 65–99)
GLUCOSE-CAPILLARY: 181 mg/dL — AB (ref 65–99)
Glucose-Capillary: 156 mg/dL — ABNORMAL HIGH (ref 65–99)
Glucose-Capillary: 180 mg/dL — ABNORMAL HIGH (ref 65–99)
Glucose-Capillary: 224 mg/dL — ABNORMAL HIGH (ref 65–99)

## 2018-04-03 SURGERY — LUMBAR LAMINECTOMY/DECOMPRESSION MICRODISCECTOMY 1 LEVEL
Anesthesia: General | Site: Spine Lumbar

## 2018-04-03 MED ORDER — OXYCODONE-ACETAMINOPHEN 10-325 MG PO TABS: 1.0000 | ORAL_TABLET | ORAL | 0 refills | Status: AC | PRN

## 2018-04-03 MED ORDER — ROCURONIUM BROMIDE 10 MG/ML (PF) SYRINGE
PREFILLED_SYRINGE | INTRAVENOUS | Status: AC
  Filled 2018-04-03: qty 5

## 2018-04-03 MED ORDER — METFORMIN HCL 500 MG PO TABS
500.0000 mg | ORAL_TABLET | Freq: Two times a day (BID) | ORAL | Status: DC
  Administered 2018-04-03 – 2018-04-04 (×2): 500 mg via ORAL
  Filled 2018-04-03 (×2): qty 1

## 2018-04-03 MED ORDER — DEXAMETHASONE SODIUM PHOSPHATE 10 MG/ML IJ SOLN
INTRAMUSCULAR | Status: AC
  Filled 2018-04-03: qty 1

## 2018-04-03 MED ORDER — ACETAMINOPHEN 10 MG/ML IV SOLN
INTRAVENOUS | Status: DC | PRN
  Administered 2018-04-03: 1000 mg via INTRAVENOUS

## 2018-04-03 MED ORDER — ONDANSETRON 4 MG PO TBDP: 4.0000 mg | ORAL_TABLET | Freq: Three times a day (TID) | ORAL | 0 refills | Status: AC | PRN

## 2018-04-03 MED ORDER — ROCURONIUM BROMIDE 100 MG/10ML IV SOLN
INTRAVENOUS | Status: DC | PRN
  Administered 2018-04-03 (×2): 20 mg via INTRAVENOUS
  Administered 2018-04-03: 60 mg via INTRAVENOUS

## 2018-04-03 MED ORDER — PANTOPRAZOLE SODIUM 40 MG PO TBEC
40.0000 mg | DELAYED_RELEASE_TABLET | Freq: Every day | ORAL | Status: DC
Start: 2018-04-03 — End: 2018-04-04
  Administered 2018-04-03 – 2018-04-04 (×2): 40 mg via ORAL
  Filled 2018-04-03 (×2): qty 1

## 2018-04-03 MED ORDER — DOCUSATE SODIUM 100 MG PO CAPS
100.0000 mg | ORAL_CAPSULE | Freq: Two times a day (BID) | ORAL | Status: DC
  Administered 2018-04-03 – 2018-04-04 (×3): 100 mg via ORAL
  Filled 2018-04-03 (×3): qty 1

## 2018-04-03 MED ORDER — CEFAZOLIN SODIUM-DEXTROSE 2-4 GM/100ML-% IV SOLN
2.0000 g | Freq: Three times a day (TID) | INTRAVENOUS | Status: AC
  Administered 2018-04-03 – 2018-04-04 (×2): 2 g via INTRAVENOUS
  Filled 2018-04-03 (×2): qty 100

## 2018-04-03 MED ORDER — PHENOL 1.4 % MT LIQD: 1.0000 | OROMUCOSAL | Status: DC | PRN

## 2018-04-03 MED ORDER — SUGAMMADEX SODIUM 200 MG/2ML IV SOLN
INTRAVENOUS | Status: DC | PRN
  Administered 2018-04-03: 500 mg via INTRAVENOUS

## 2018-04-03 MED ORDER — LIDOCAINE 2% (20 MG/ML) 5 ML SYRINGE
INTRAMUSCULAR | Status: AC
  Filled 2018-04-03: qty 5

## 2018-04-03 MED ORDER — BUPIVACAINE-EPINEPHRINE (PF) 0.25% -1:200000 IJ SOLN
INTRAMUSCULAR | Status: AC
  Filled 2018-04-03: qty 30

## 2018-04-03 MED ORDER — ACETAMINOPHEN 10 MG/ML IV SOLN
INTRAVENOUS | Status: AC
  Filled 2018-04-03: qty 100

## 2018-04-03 MED ORDER — HYDROMORPHONE HCL 1 MG/ML IJ SOLN
INTRAMUSCULAR | Status: AC
  Filled 2018-04-03: qty 0.5

## 2018-04-03 MED ORDER — METHYLPREDNISOLONE ACETATE 40 MG/ML IJ SUSP
INTRAMUSCULAR | Status: DC | PRN
  Administered 2018-04-03: 40 mg

## 2018-04-03 MED ORDER — HYDROMORPHONE HCL 1 MG/ML IJ SOLN
INTRAMUSCULAR | Status: DC | PRN
  Administered 2018-04-03: 0.5 mg via INTRAVENOUS

## 2018-04-03 MED ORDER — SODIUM CHLORIDE 0.9% FLUSH: 3.0000 mL | INTRAVENOUS | Status: DC | PRN

## 2018-04-03 MED ORDER — MIDAZOLAM HCL 2 MG/2ML IJ SOLN
INTRAMUSCULAR | Status: AC
  Filled 2018-04-03: qty 2

## 2018-04-03 MED ORDER — ONDANSETRON HCL 4 MG/2ML IJ SOLN: 4.0000 mg | Freq: Four times a day (QID) | INTRAMUSCULAR | Status: DC | PRN

## 2018-04-03 MED ORDER — METHOCARBAMOL 500 MG PO TABS: 500.0000 mg | ORAL_TABLET | Freq: Three times a day (TID) | ORAL | 0 refills | Status: DC

## 2018-04-03 MED ORDER — MENTHOL 3 MG MT LOZG: 1.0000 | LOZENGE | OROMUCOSAL | Status: DC | PRN

## 2018-04-03 MED ORDER — GLYCOPYRROLATE 0.2 MG/ML IJ SOLN
INTRAMUSCULAR | Status: DC | PRN
  Administered 2018-04-03 (×2): 0.2 mg via INTRAVENOUS

## 2018-04-03 MED ORDER — LOSARTAN POTASSIUM 50 MG PO TABS
50.0000 mg | ORAL_TABLET | Freq: Every day | ORAL | Status: DC
  Administered 2018-04-03: 50 mg via ORAL
  Filled 2018-04-03: qty 1

## 2018-04-03 MED ORDER — INSULIN ASPART 100 UNIT/ML ~~LOC~~ SOLN
0.0000 [IU] | Freq: Every day | SUBCUTANEOUS | Status: DC
  Administered 2018-04-03: 2 [IU] via SUBCUTANEOUS

## 2018-04-03 MED ORDER — OXYCODONE HCL 5 MG PO TABS: 5.0000 mg | ORAL_TABLET | Freq: Once | ORAL | Status: DC | PRN

## 2018-04-03 MED ORDER — 0.9 % SODIUM CHLORIDE (POUR BTL) OPTIME
TOPICAL | Status: DC | PRN
  Administered 2018-04-03: 1000 mL

## 2018-04-03 MED ORDER — SERTRALINE HCL 50 MG PO TABS
100.0000 mg | ORAL_TABLET | Freq: Every day | ORAL | Status: DC
  Administered 2018-04-03: 100 mg via ORAL
  Filled 2018-04-03: qty 2

## 2018-04-03 MED ORDER — ONDANSETRON HCL 4 MG/2ML IJ SOLN
INTRAMUSCULAR | Status: AC
  Filled 2018-04-03: qty 2

## 2018-04-03 MED ORDER — FENTANYL CITRATE (PF) 100 MCG/2ML IJ SOLN
25.0000 ug | INTRAMUSCULAR | Status: DC | PRN
  Administered 2018-04-03 (×3): 50 ug via INTRAVENOUS

## 2018-04-03 MED ORDER — LACTATED RINGERS IV SOLN: INTRAVENOUS | Status: DC

## 2018-04-03 MED ORDER — INSULIN ASPART 100 UNIT/ML ~~LOC~~ SOLN
0.0000 [IU] | Freq: Three times a day (TID) | SUBCUTANEOUS | Status: DC
  Administered 2018-04-04: 5 [IU] via SUBCUTANEOUS
  Administered 2018-04-04: 3 [IU] via SUBCUTANEOUS

## 2018-04-03 MED ORDER — SODIUM CHLORIDE 0.9% FLUSH
3.0000 mL | Freq: Two times a day (BID) | INTRAVENOUS | Status: DC
  Administered 2018-04-03: 3 mL via INTRAVENOUS

## 2018-04-03 MED ORDER — ATORVASTATIN CALCIUM 20 MG PO TABS
40.0000 mg | ORAL_TABLET | Freq: Every day | ORAL | Status: DC
  Administered 2018-04-03: 40 mg via ORAL
  Filled 2018-04-03: qty 2

## 2018-04-03 MED ORDER — MAGNESIUM CITRATE PO SOLN
1.0000 | Freq: Once | ORAL | Status: AC | PRN
  Administered 2018-04-03: 1 via ORAL
  Filled 2018-04-03: qty 296

## 2018-04-03 MED ORDER — ONDANSETRON HCL 4 MG/2ML IJ SOLN: 4.0000 mg | Freq: Once | INTRAMUSCULAR | Status: DC | PRN

## 2018-04-03 MED ORDER — FENTANYL CITRATE (PF) 100 MCG/2ML IJ SOLN
INTRAMUSCULAR | Status: AC
  Administered 2018-04-03: 50 ug via INTRAVENOUS
  Filled 2018-04-03: qty 2

## 2018-04-03 MED ORDER — INSULIN ASPART 100 UNIT/ML ~~LOC~~ SOLN
0.0000 [IU] | SUBCUTANEOUS | Status: DC
  Administered 2018-04-03 (×2): 3 [IU] via SUBCUTANEOUS

## 2018-04-03 MED ORDER — OXYCODONE HCL 5 MG PO TABS
10.0000 mg | ORAL_TABLET | ORAL | Status: DC | PRN
  Administered 2018-04-03 – 2018-04-04 (×6): 10 mg via ORAL
  Filled 2018-04-03 (×6): qty 2

## 2018-04-03 MED ORDER — SUMATRIPTAN SUCCINATE 100 MG PO TABS
100.0000 mg | ORAL_TABLET | ORAL | Status: DC | PRN
  Filled 2018-04-03 (×2): qty 1

## 2018-04-03 MED ORDER — THROMBIN (RECOMBINANT) 20000 UNITS EX SOLR
CUTANEOUS | Status: DC | PRN
  Administered 2018-04-03: 20 mL via TOPICAL

## 2018-04-03 MED ORDER — ACETAMINOPHEN 160 MG/5ML PO SOLN: 325.0000 mg | ORAL | Status: DC | PRN

## 2018-04-03 MED ORDER — FENTANYL CITRATE (PF) 250 MCG/5ML IJ SOLN
INTRAMUSCULAR | Status: AC
  Filled 2018-04-03: qty 5

## 2018-04-03 MED ORDER — ONDANSETRON HCL 4 MG PO TABS: 4.0000 mg | ORAL_TABLET | Freq: Four times a day (QID) | ORAL | Status: DC | PRN

## 2018-04-03 MED ORDER — OXYCODONE HCL 5 MG PO TABS: 5.0000 mg | ORAL_TABLET | ORAL | Status: DC | PRN

## 2018-04-03 MED ORDER — LIDOCAINE HCL (CARDIAC) PF 100 MG/5ML IV SOSY
PREFILLED_SYRINGE | INTRAVENOUS | Status: DC | PRN
  Administered 2018-04-03: 100 mg via INTRAVENOUS

## 2018-04-03 MED ORDER — OXYCODONE HCL 5 MG/5ML PO SOLN: 5.0000 mg | Freq: Once | ORAL | Status: DC | PRN

## 2018-04-03 MED ORDER — ACETAMINOPHEN 325 MG PO TABS: 650.0000 mg | ORAL_TABLET | ORAL | Status: DC | PRN

## 2018-04-03 MED ORDER — SODIUM CHLORIDE 0.9 % IV SOLN: 250.0000 mL | INTRAVENOUS | Status: DC

## 2018-04-03 MED ORDER — FENTANYL CITRATE (PF) 100 MCG/2ML IJ SOLN
INTRAMUSCULAR | Status: AC
  Filled 2018-04-03: qty 2

## 2018-04-03 MED ORDER — DEXTROSE 5 % IV SOLN
500.0000 mg | Freq: Four times a day (QID) | INTRAVENOUS | Status: DC | PRN
  Filled 2018-04-03: qty 5

## 2018-04-03 MED ORDER — ROPINIROLE HCL 0.25 MG PO TABS
0.2500 mg | ORAL_TABLET | Freq: Every day | ORAL | Status: DC
  Administered 2018-04-03: 0.25 mg via ORAL
  Filled 2018-04-03: qty 1

## 2018-04-03 MED ORDER — MIDAZOLAM HCL 5 MG/5ML IJ SOLN
INTRAMUSCULAR | Status: DC | PRN
  Administered 2018-04-03: 2 mg via INTRAVENOUS

## 2018-04-03 MED ORDER — METHYLPREDNISOLONE ACETATE 40 MG/ML IJ SUSP
INTRAMUSCULAR | Status: AC
  Filled 2018-04-03: qty 1

## 2018-04-03 MED ORDER — ACETAMINOPHEN 650 MG RE SUPP: 650.0000 mg | RECTAL | Status: DC | PRN

## 2018-04-03 MED ORDER — PROPOFOL 10 MG/ML IV BOLUS
INTRAVENOUS | Status: DC | PRN
  Administered 2018-04-03: 200 mg via INTRAVENOUS

## 2018-04-03 MED ORDER — HYDROMORPHONE HCL 1 MG/ML IJ SOLN: 1.0000 mg | INTRAMUSCULAR | Status: DC | PRN

## 2018-04-03 MED ORDER — PHENYLEPHRINE HCL 10 MG/ML IJ SOLN
INTRAVENOUS | Status: DC | PRN
  Administered 2018-04-03: 40 ug/min via INTRAVENOUS

## 2018-04-03 MED ORDER — PHENYLEPHRINE HCL 10 MG/ML IJ SOLN
INTRAMUSCULAR | Status: DC | PRN
  Administered 2018-04-03 (×4): 80 ug via INTRAVENOUS

## 2018-04-03 MED ORDER — CEFAZOLIN SODIUM-DEXTROSE 2-3 GM-%(50ML) IV SOLR
INTRAVENOUS | Status: DC | PRN
  Administered 2018-04-03: 2 g via INTRAVENOUS

## 2018-04-03 MED ORDER — MEPERIDINE HCL 50 MG/ML IJ SOLN: 6.2500 mg | INTRAMUSCULAR | Status: DC | PRN

## 2018-04-03 MED ORDER — HEMOSTATIC AGENTS (NO CHARGE) OPTIME
TOPICAL | Status: DC | PRN
  Administered 2018-04-03: 1 via TOPICAL

## 2018-04-03 MED ORDER — PHENYLEPHRINE 40 MCG/ML (10ML) SYRINGE FOR IV PUSH (FOR BLOOD PRESSURE SUPPORT)
PREFILLED_SYRINGE | INTRAVENOUS | Status: AC
  Filled 2018-04-03: qty 10

## 2018-04-03 MED ORDER — LACTATED RINGERS IV SOLN
INTRAVENOUS | Status: DC | PRN
  Administered 2018-04-03 (×2): via INTRAVENOUS

## 2018-04-03 MED ORDER — METHOCARBAMOL 500 MG PO TABS
500.0000 mg | ORAL_TABLET | Freq: Four times a day (QID) | ORAL | Status: DC | PRN
  Administered 2018-04-03 – 2018-04-04 (×3): 500 mg via ORAL
  Filled 2018-04-03 (×3): qty 1

## 2018-04-03 MED ORDER — THROMBIN (RECOMBINANT) 20000 UNITS EX SOLR
CUTANEOUS | Status: AC
  Filled 2018-04-03: qty 20000

## 2018-04-03 MED ORDER — ONDANSETRON HCL 4 MG/2ML IJ SOLN
INTRAMUSCULAR | Status: DC | PRN
  Administered 2018-04-03: 4 mg via INTRAVENOUS

## 2018-04-03 MED ORDER — BUPIVACAINE-EPINEPHRINE 0.25% -1:200000 IJ SOLN
INTRAMUSCULAR | Status: DC | PRN
  Administered 2018-04-03: 30 mg

## 2018-04-03 MED ORDER — FENTANYL CITRATE (PF) 100 MCG/2ML IJ SOLN
INTRAMUSCULAR | Status: DC | PRN
  Administered 2018-04-03: 50 ug via INTRAVENOUS
  Administered 2018-04-03: 100 ug via INTRAVENOUS
  Administered 2018-04-03 (×2): 50 ug via INTRAVENOUS

## 2018-04-03 MED ORDER — DEXAMETHASONE SODIUM PHOSPHATE 4 MG/ML IJ SOLN
INTRAMUSCULAR | Status: DC | PRN
  Administered 2018-04-03: 10 mg via INTRAVENOUS

## 2018-04-03 MED ORDER — ACETAMINOPHEN 325 MG PO TABS: 325.0000 mg | ORAL_TABLET | ORAL | Status: DC | PRN

## 2018-04-03 MED ORDER — OXYCODONE-ACETAMINOPHEN 10-325 MG PO TABS: 1.0000 | ORAL_TABLET | Freq: Four times a day (QID) | ORAL | 0 refills | Status: DC | PRN

## 2018-04-03 MED ORDER — PROPOFOL 10 MG/ML IV BOLUS
INTRAVENOUS | Status: AC
  Filled 2018-04-03: qty 20

## 2018-04-03 MED ORDER — POLYETHYLENE GLYCOL 3350 17 G PO PACK: 17.0000 g | PACK | Freq: Every day | ORAL | Status: DC | PRN

## 2018-04-03 SURGICAL SUPPLY — 62 items
BNDG GAUZE ELAST 4 BULKY (GAUZE/BANDAGES/DRESSINGS) ×3 IMPLANT
BUR EGG ELITE 4.0 (BURR) IMPLANT
BUR EGG ELITE 4.0MM (BURR)
BUR MATCHSTICK NEURO 3.0 LAGG (BURR) IMPLANT
CANISTER SUCT 3000ML PPV (MISCELLANEOUS) ×3 IMPLANT
CLOSURE STERI-STRIP 1/2X4 (GAUZE/BANDAGES/DRESSINGS) ×1
CLSR STERI-STRIP ANTIMIC 1/2X4 (GAUZE/BANDAGES/DRESSINGS) ×2 IMPLANT
CORD BI POLAR (MISCELLANEOUS) ×3 IMPLANT
COVER SURGICAL LIGHT HANDLE (MISCELLANEOUS) ×3 IMPLANT
DECANTER SPIKE VIAL GLASS SM (MISCELLANEOUS) ×3 IMPLANT
DRAIN CHANNEL 15F RND FF W/TCR (WOUND CARE) IMPLANT
DRAPE POUCH INSTRU U-SHP 10X18 (DRAPES) ×3 IMPLANT
DRAPE SURG 17X23 STRL (DRAPES) ×3 IMPLANT
DRAPE U-SHAPE 47X51 STRL (DRAPES) ×3 IMPLANT
DRSG OPSITE POSTOP 3X4 (GAUZE/BANDAGES/DRESSINGS) ×6 IMPLANT
DRSG OPSITE POSTOP 4X6 (GAUZE/BANDAGES/DRESSINGS) ×3 IMPLANT
DURAPREP 26ML APPLICATOR (WOUND CARE) ×3 IMPLANT
ELECT BLADE 4.0 EZ CLEAN MEGAD (MISCELLANEOUS) ×3
ELECT CAUTERY BLADE 6.4 (BLADE) ×3 IMPLANT
ELECT PENCIL ROCKER SW 15FT (MISCELLANEOUS) ×3 IMPLANT
ELECT REM PT RETURN 9FT ADLT (ELECTROSURGICAL) ×3
ELECTRODE BLDE 4.0 EZ CLN MEGD (MISCELLANEOUS) ×1 IMPLANT
ELECTRODE REM PT RTRN 9FT ADLT (ELECTROSURGICAL) ×1 IMPLANT
EVACUATOR SILICONE 100CC (DRAIN) IMPLANT
FLOSEAL 10ML (HEMOSTASIS) ×3 IMPLANT
GLOVE BIO SURGEON STRL SZ 6.5 (GLOVE) ×2 IMPLANT
GLOVE BIO SURGEONS STRL SZ 6.5 (GLOVE) ×1
GLOVE BIOGEL PI IND STRL 6.5 (GLOVE) ×1 IMPLANT
GLOVE BIOGEL PI IND STRL 8.5 (GLOVE) ×1 IMPLANT
GLOVE BIOGEL PI INDICATOR 6.5 (GLOVE) ×2
GLOVE BIOGEL PI INDICATOR 8.5 (GLOVE) ×2
GLOVE SS BIOGEL STRL SZ 8.5 (GLOVE) ×1 IMPLANT
GLOVE SUPERSENSE BIOGEL SZ 8.5 (GLOVE) ×2
GOWN STRL REUS W/ TWL LRG LVL3 (GOWN DISPOSABLE) ×2 IMPLANT
GOWN STRL REUS W/TWL 2XL LVL3 (GOWN DISPOSABLE) ×3 IMPLANT
GOWN STRL REUS W/TWL LRG LVL3 (GOWN DISPOSABLE) ×4
KIT BASIN OR (CUSTOM PROCEDURE TRAY) ×3 IMPLANT
KIT TURNOVER KIT B (KITS) ×3 IMPLANT
NEEDLE 22X1 1/2 (OR ONLY) (NEEDLE) ×3 IMPLANT
NEEDLE SPNL 18GX3.5 QUINCKE PK (NEEDLE) ×6 IMPLANT
NS IRRIG 1000ML POUR BTL (IV SOLUTION) ×3 IMPLANT
PACK LAMINECTOMY ORTHO (CUSTOM PROCEDURE TRAY) ×3 IMPLANT
PACK UNIVERSAL I (CUSTOM PROCEDURE TRAY) ×3 IMPLANT
PAD ARMBOARD 7.5X6 YLW CONV (MISCELLANEOUS) ×6 IMPLANT
PATTIES SURGICAL .5 X.5 (GAUZE/BANDAGES/DRESSINGS) ×3 IMPLANT
PATTIES SURGICAL .5 X1 (DISPOSABLE) ×6 IMPLANT
SPONGE SURGIFOAM ABS GEL 100 (HEMOSTASIS) ×3 IMPLANT
SUT BONE WAX W31G (SUTURE) ×3 IMPLANT
SUT MNCRL AB 3-0 PS2 18 (SUTURE) ×3 IMPLANT
SUT MON AB 3-0 SH 27 (SUTURE) ×4
SUT MON AB 3-0 SH27 (SUTURE) ×2 IMPLANT
SUT VIC AB 0 CT1 27 (SUTURE)
SUT VIC AB 0 CT1 27XBRD ANBCTR (SUTURE) IMPLANT
SUT VIC AB 1 CT1 18XCR BRD 8 (SUTURE) IMPLANT
SUT VIC AB 1 CT1 8-18 (SUTURE)
SUT VIC AB 2-0 CT1 18 (SUTURE) IMPLANT
SYR BULB IRRIGATION 50ML (SYRINGE) ×3 IMPLANT
SYR CONTROL 10ML LL (SYRINGE) ×3 IMPLANT
TOWEL GREEN STERILE (TOWEL DISPOSABLE) ×6 IMPLANT
TOWEL GREEN STERILE FF (TOWEL DISPOSABLE) ×3 IMPLANT
WATER STERILE IRR 1000ML POUR (IV SOLUTION) IMPLANT
YANKAUER SUCT BULB TIP NO VENT (SUCTIONS) IMPLANT

## 2018-04-03 NOTE — Op Note (Signed)
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  Positive left L5 radiculopathy  Postoperative diagnosis: Same  Operative procedure: L4-5 decompression and discectomy.  (Bilateral hemilaminotomy, foraminotomy and partial discectomy left side)  First Assistant: Anette Riedel, PA  Indications: This is a very pleasant 52 year old gentleman who had a previous L5-S1 total disc replacement several years ago who presented to my care again with complaints of back buttock and bilateral leg pain left side worse than the right.  Imaging studies confirmed an adjacent segment L4-5 central and posterior lateral to the left disc herniation causing central and bilateral lateral recess stenosis.  As a result of the failure of conservative management we elected to move forward with surgery.  Complications: None  Operative report: Patient was brought the operating room placed upon the operating room table.  After successful induction of general anesthesia and endotracheal intubation teds SCDs were applied and he was turned prone onto the Wilson frame.  All bony prominences were well-padded and the back was then prepped and draped in a standard fashion.  Timeout was taken to confirm patient, procedure and all other important data.  2 needles were then placed into the back and an x-ray was taken to localize the incision site.  The incision site was localized marked out and infiltrated with quarter percent Marcaine with epinephrine.  Midline incision was made and sharp dissection was carried out down to the deep fascia.  Deep fascia was sharply incised and the paraspinal muscles were stripped to expose the L4 and L5 spinous process and lamina.  Care was taken not to violate the facet capsule.  Self-retaining retractors were then placed and I completely exposed the posterior elements of L4-5.  Penfield 4 was placed underneath the L4 lamina and a  second x-ray was taken to confirm that I was at the appropriate level.  Once this was confirmed inferior portion of the spinous process of L4 was removed with a double-action Leksell rongeur and bilateral partial hemilaminotomies were performed with a 3 mm Kerrison punch.  This was taken superiorly up to the insertion site of the ligamentum flavum.  Once this was identified I used a Penfield for to dissect through the central raphae of the ligamentum flavum.  I then remove the ligamentum flavum with a 3 mm Kerrison rongeur to expose the dorsal surface of the thecal sac.  Inferiorly I did undercut the L5 lamina to adequately decompress the central portion of the spine.  Once this was done I then worked my way into the lateral recess.  There was significant stenosis on the left-hand side moderate stenosis on the right side.  I began on that right side.  Using a feel for I gently dissected to create a plane between the ligamentum flavum and the thecal sac.  Once I had the lateral aspect of the thecal sac exposed and protected I then resected the thickened ligamentum flavum in the lateral recess and a portion of the medial aspect of the facet.  I then identified the traversing L5 nerve root and went around the L5 pedicle using a 2 mm Kerrison punch performed a foraminotomy.  At this point I decompression the lateral recess was adequate.  I exposed the L4-5 disc space itself.  Epidural veins were very prominent were all coagulated with bipolar cautery.  I then proceeded onto the left-hand side.  As I was dissecting into the lateral recess I did note that there is  a large osteophyte medial side of the facet complex causing compression to the traversing L5 nerve root.  I gently created a plane between the thecal sac and remove the ligamentum flavum/bone spur my Penfield 4.  This allowed the use my 2 mm Kerrison punch to remove the ligamentum flavum and bone spur.  At this point the lateral recess was completely  decompressed.  I then traced the L5 nerve root into the L5 foramen and a 2 mm Kerrison punch was used to perform a foraminotomy.  At this point I had a adequate central and lateral recess and foraminal decompression.  I exposed the left side of the L4-5 disc space and again coagulated the large epidural veins.  I then incised the annulus with a 15 blade scalpel and then removed portions of the subannular disc herniation that was seen on the MRI.  I also used my Epstein curettes to adequately decompress the disc.  Once I had removed all of the disc material I again checked my overall decompression.  Both L5 nerve roots were completely free of tension and were easily mobile.  Lateral recess was completely decompressed I could easily pass my Woodson elevator superiorly inferiorly and circumferentially at the level of the annulus.  At this point I copiously irrigated the wound with normal saline and obtained hemostasis using bipolar cautery and FloSeal.  After final irrigation I did place some Depo-Medrol 1 cc (40 mg) over the exiting nerve roots for postoperative analgesia is admitted relation was required for the decompression portion of the surgery.  A thrombin-soaked Gelfoam patty was placed over the laminotomy site and then I closed the wound in a layered fashion with interrupted #1 Vicryl suture, 2-0 Vicryl suture, and 3-0 Monocryl.  Steri-Strips dry dressing were applied the patient was ultimately extubated transferred the PACU that incident.  The end of the case all needle sponge counts were correct.  There is no adverse intraoperative events.

## 2018-04-03 NOTE — Evaluation (Signed)
Physical Therapy Evaluation Patient Details Name: Cory LewandowskyDennis Holland MRN: [id number] DOB: 1966-01-27 Today's Date: 04/03/2018   History of Present Illness  Pt is a 52 y/o male s/p L4-5 decompression. PMH includes DM, HTN, and OSA on CPAP.   Clinical Impression  Patient is s/p above surgery resulting in the deficits listed below (see PT Problem List). Pt with mild unsteadiness and required min to min guard A for mobility. Educated about back precautions and generalized walking program.  Patient will benefit from skilled PT to increase their independence and safety with mobility (while adhering to their precautions) to allow discharge to the venue listed below.     Follow Up Recommendations No PT follow up;Supervision for mobility/OOB    Equipment Recommendations  None recommended by PT    Recommendations for Other Services       Precautions / Restrictions Precautions Precautions: Back Precaution Booklet Issued: Yes (comment) Precaution Comments: Reviewed back precautions with pt.  Required Braces or Orthoses: Spinal Brace Spinal Brace: Lumbar corset;Applied in sitting position Restrictions Weight Bearing Restrictions: No      Mobility  Bed Mobility Overal bed mobility: Needs Assistance Bed Mobility: Rolling;Sidelying to Sit;Sit to Sidelying Rolling: Supervision Sidelying to sit: Supervision     Sit to sidelying: Supervision General bed mobility comments: Supervision to ensure log roll technique. Increased time required to perform. Verbal cues for sequencing.   Transfers Overall transfer level: Needs assistance Equipment used: 1 person hand held assist Transfers: Sit to/from Stand Sit to Stand: Min assist         General transfer comment: Min A for lift assist and steadying assist.   Ambulation/Gait Ambulation/Gait assistance: Min guard;Min assist Ambulation Distance (Feet): 250 Feet Assistive device: 1 person hand held assist(IV pole ) Gait Pattern/deviations:  Step-through pattern;Decreased stride length Gait velocity: Decreased  Gait velocity interpretation: <1.8 ft/sec, indicate of risk for recurrent falls General Gait Details: Slow, very guarded gait. Mild unsteadiness noted and required min guard to min A. Educated about generalized walking program to perform at home.   Stairs            Wheelchair Mobility    Modified Rankin (Stroke Patients Only)       Balance Overall balance assessment: Needs assistance Sitting-balance support: No upper extremity supported;Feet supported Sitting balance-Leahy Scale: Good     Standing balance support: Bilateral upper extremity supported;No upper extremity supported;During functional activity Standing balance-Leahy Scale: Fair Standing balance comment: Able to maintain static standing without UE support.                             Pertinent Vitals/Pain Pain Assessment: 0-10 Pain Score: 5  Pain Location: back  Pain Descriptors / Indicators: Aching;Operative site guarding Pain Intervention(s): Limited activity within patient's tolerance;Monitored during session;Repositioned    Home Living Family/patient expects to be discharged to:: Private residence Living Arrangements: Spouse/significant other;Children Available Help at Discharge: Family;Available 24 hours/day Type of Home: House Home Access: Stairs to enter Entrance Stairs-Rails: Left Entrance Stairs-Number of Steps: 4 Home Layout: One level Home Equipment: Walker - 2 wheels;Cane - single point;Shower seat      Prior Function Level of Independence: Independent               Hand Dominance        Extremity/Trunk Assessment   Upper Extremity Assessment Upper Extremity Assessment: Defer to OT evaluation    Lower Extremity Assessment Lower Extremity Assessment: Generalized weakness  Cervical / Trunk Assessment Cervical / Trunk Assessment: Normal  Communication   Communication: No difficulties   Cognition Arousal/Alertness: Awake/alert Behavior During Therapy: WFL for tasks assessed/performed Overall Cognitive Status: Within Functional Limits for tasks assessed                                        General Comments General comments (skin integrity, edema, etc.): Pt's wife present at end of session    Exercises     Assessment/Plan    PT Assessment Patient needs continued PT services  PT Problem List Decreased balance;Decreased mobility;Decreased knowledge of use of DME;Decreased strength;Decreased knowledge of precautions;Pain       PT Treatment Interventions Gait training;Stair training;Functional mobility training;Therapeutic activities;Therapeutic exercise;Balance training;Cognitive remediation    PT Goals (Current goals can be found in the Care Plan section)  Acute Rehab PT Goals Patient Stated Goal: to go home  PT Goal Formulation: With patient Time For Goal Achievement: 04/17/18 Potential to Achieve Goals: Good    Frequency Min 5X/week   Barriers to discharge        Co-evaluation               AM-PAC PT "6 Clicks" Daily Activity  Outcome Measure Difficulty turning over in bed (including adjusting bedclothes, sheets and blankets)?: A Little Difficulty moving from lying on back to sitting on the side of the bed? : A Little Difficulty sitting down on and standing up from a chair with arms (e.g., wheelchair, bedside commode, etc,.)?: Unable Help needed moving to and from a bed to chair (including a wheelchair)?: A Little Help needed walking in hospital room?: A Little Help needed climbing 3-5 steps with a railing? : A Lot 6 Click Score: 15    End of Session Equipment Utilized During Treatment: Gait belt;Back brace Activity Tolerance: Patient tolerated treatment well Patient left: in bed;with call bell/phone within reach;with family/visitor present Nurse Communication: Mobility status PT Visit Diagnosis: Other abnormalities of gait  and mobility (R26.89);Pain;Unsteadiness on feet (R26.81) Pain - part of body: (back )    Time: 1610-9604 PT Time Calculation (min) (ACUTE ONLY): 26 min   Charges:   PT Evaluation $PT Eval Low Complexity: 1 Low PT Treatments $Gait Training: 8-22 mins   PT G Codes:        Gladys Damme, PT, DPT  Acute Rehabilitation Services  Pager: [phone]   Le[facility] Prom 04/03/2018, 4:12 PM

## 2018-04-03 NOTE — Discharge Instructions (Signed)
Lumbar Diskectomy, Care After °Refer to this sheet in the next few weeks. These instructions provide you with information about caring for yourself after your procedure. Your health care provider may also give you more specific instructions. Your treatment has been planned according to current medical practices, but problems sometimes occur. Call your health care provider if you have any problems or questions after your procedure. °What can I expect after the procedure? °After the procedure, it is common to have: °· Pain. °· Numbness. °· Weakness. ° °Follow these instructions at home: °Medicines °· Take medicines only as directed by your health care provider. °· If you were prescribed an antibiotic medicine, finish all of it even if you start to feel better. °Incision care °· There are many different ways to close and cover an incision, including stitches (sutures), skin glue, and adhesive strips. Follow your health care provider's instructions about: °? Incision care. °? Bandage (dressing) changes and removal. °? Incision closure removal. °· Check your incision area every day for signs of infection. If you cannot see your incision, have someone check it for you. Watch for: °? Redness, swelling, or pain. °? Fluid, blood, or pus. °Activity °· Avoid sitting for longer than 20 minutes at a time or as directed by your health care provider. °· Do not climb stairs more than once each day until your health care provider approves. °· Do not bend at your waist. To pick things up, bend your knees. °· Do not lift anything that is heavier than 10 lb (4.5 kg) or as directed by your health care provider. °· Do not drive a car until your health care provider approves. °· Ask your health care provider when you may return to your normal activities, such as playing sports and going back to work. °· Work with your physical therapist to learn safe movement and exercises to help healing. Do these exercises as directed. °· Take short  walks often. °General instructions °· Do not use any tobacco products, including cigarettes, chewing tobacco, or electronic cigarettes. If you need help quitting, ask your health care provider. °· Follow your health care provider’s instructions about bathing. Do not take baths, shower, swim, or use a hot tub until your health care provider approves. °· Wear your back brace as directed by your health care provider. °· To prevent constipation: °? Drink enough fluid to keep your urine clear or pale yellow. °? Eat plenty of fruits, vegetables, and whole grains. °· Keep all follow-up visits as directed by your health care provider. This is important. This includes any follow-up visits with your physical therapist. °Contact a health care provider if: °· You have a fever. °· You have redness, swelling, or pain in your incision area. °· Your pain is not controlled with medicine. °· You have pain, numbness, or weakness that lasts longer than three weeks after surgery. °· You become constipated. °Get help right away if: °· You have fluid, blood, or pus coming from your incision. °· You have increasing pain, numbness, or weakness. °· You lose control of when you urinate or have a bowel movement (incontinence). °· You have chest pain. °· You have trouble breathing. °This information is not intended to replace advice given to you by your health care provider. Make sure you discuss any questions you have with your health care provider. °Document Released: 11/07/2004 Document Revised: 05/10/2016 Document Reviewed: 07/28/2014 °Elsevier Interactive Patient Education © 2018 [employer] ° °

## 2018-04-03 NOTE — Brief Op Note (Signed)
04/03/2018  10:11 AM  PATIENT:  Cory Holland  52 y.o. male  PRE-OPERATIVE DIAGNOSIS:  HNP L4-5 with stenosis  POST-OPERATIVE DIAGNOSIS:  HNP L4-5 with stenosis  PROCEDURE:  Procedure(s) with comments: Lumbar L4-5 decompression/disectomy (N/A) - 120 mins  SURGEON:  Surgeon(s) and Role:    Venita Lick* Lewayne Pauley, MD - Primary  PHYSICIAN ASSISTANT:   ASSISTANTS: Anette Riedelarmen Mayo, PA   ANESTHESIA:   general  EBL:  minimal   BLOOD ADMINISTERED:none  DRAINS: none   LOCAL MEDICATIONS USED:  MARCAINE    and OTHER depo-medrol  SPECIMEN:  No Specimen  DISPOSITION OF SPECIMEN:  N/A  COUNTS:  YES  TOURNIQUET:  * No tourniquets in log *  DICTATION: .Dragon Dictation  PLAN OF CARE: Admit for overnight observation  PATIENT DISPOSITION:  PACU - hemodynamically stable.

## 2018-04-03 NOTE — Anesthesia Procedure Notes (Signed)
Procedure Name: Intubation Date/Time: 04/03/2018 7:46 AM Performed by: Sonoma Firkus T, CRNA Pre-anesthesia Checklist: Patient identified, Emergency Drugs available, Suction available and Patient being monitored Patient Re-evaluated:Patient Re-evaluated prior to induction Oxygen Delivery Method: Circle system utilized Preoxygenation: Pre-oxygenation with 100% oxygen Induction Type: IV induction Ventilation: Mask ventilation without difficulty and Oral airway inserted - appropriate to patient size Laryngoscope Size: Miller and 3 Grade View: Grade II Tube type: Oral Tube size: 7.5 mm Number of attempts: 1 Airway Equipment and Method: Patient positioned with wedge pillow and Stylet Placement Confirmation: ETT inserted through vocal cords under direct vision,  positive ETCO2 and breath sounds checked- equal and bilateral Secured at: 22 cm Tube secured with: Tape Dental Injury: Teeth and Oropharynx as per pre-operative assessment

## 2018-04-03 NOTE — Anesthesia Postprocedure Evaluation (Signed)
Anesthesia Post Note  Patient: Cory Holland  Procedure(s) Performed: Lumbar L4-5 decompression/disectomy (N/A Spine Lumbar)     Patient location during evaluation: PACU Anesthesia Type: General Level of consciousness: awake and alert Pain management: pain level controlled Vital Signs Assessment: post-procedure vital signs reviewed and stable Respiratory status: spontaneous breathing, nonlabored ventilation, respiratory function stable and patient connected to nasal cannula oxygen Cardiovascular status: blood pressure returned to baseline and stable Postop Assessment: no apparent nausea or vomiting Anesthetic complications: no    Last Vitals:  Vitals:   04/03/18 1115 04/03/18 1136  BP: 101/70 110/71  Pulse: (!) 113 (!) 104  Resp: 14 20  Temp: 37.1 C 37.1 C  SpO2: 94% 94%    Last Pain:  Vitals:   04/03/18 1140  TempSrc:   PainSc: 4                  Maritza Hosterman

## 2018-04-03 NOTE — Transfer of Care (Signed)
Immediate Anesthesia Transfer of Care Note  Patient: Cory Holland  Procedure(s) Performed: Lumbar L4-5 decompression/disectomy (N/A Spine Lumbar)  Patient Location: PACU  Anesthesia Type:General  Level of Consciousness: awake, alert  and oriented  Airway & Oxygen Therapy: Patient Spontanous Breathing and Patient connected to nasal cannula oxygen  Post-op Assessment: Report given to RN, Post -op Vital signs reviewed and stable and Patient moving all extremities  Post vital signs: Reviewed and stable  Last Vitals:  Vitals Value Taken Time  BP 117/73 04/03/2018 10:29 AM  Temp    Pulse 124 04/03/2018 10:33 AM  Resp 20 04/03/2018 10:33 AM  SpO2 100 % 04/03/2018 10:33 AM  Vitals shown include unvalidated device data.  Last Pain:  Vitals:   04/03/18 0645  TempSrc:   PainSc: 4       Patients Stated Pain Goal: 3 (04/03/18 0645)  Complications: No apparent anesthesia complications

## 2018-04-04 ENCOUNTER — Encounter (HOSPITAL_COMMUNITY): Payer: Self-pay | Admitting: Orthopedic Surgery

## 2018-04-04 ENCOUNTER — Other Ambulatory Visit: Payer: Self-pay

## 2018-04-04 DIAGNOSIS — M5116 Intervertebral disc disorders with radiculopathy, lumbar region: Secondary | ICD-10-CM | POA: Diagnosis not present

## 2018-04-04 LAB — GLUCOSE, CAPILLARY
GLUCOSE-CAPILLARY: 159 mg/dL — AB (ref 65–99)
Glucose-Capillary: 218 mg/dL — ABNORMAL HIGH (ref 65–99)

## 2018-04-04 MED ORDER — FLEET ENEMA 7-19 GM/118ML RE ENEM
1.0000 | ENEMA | Freq: Once | RECTAL | Status: AC
  Administered 2018-04-04: 1 via RECTAL
  Filled 2018-04-04: qty 1

## 2018-04-04 NOTE — Discharge Summary (Signed)
Physician Discharge Summary  Patient ID: Cory LewandowskyDennis Holland MRN: [id number] DOB/AGE: [birth year] 51 y.o.  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 Diabetes mellitus without complication (HCC)   . DJD (degenerative joint disease)   . Family history of adverse reaction to anesthesia    "mom gets sick"  . GERD (gastroesophageal reflux disease)   . Headache(784.0)    migraines  . History of kidney stones   . Hypertension   . Shortness of breath    with exertion  . Sleep apnea    CPAP 5    Discharge Diagnoses:  Active Problems:   Status post lumbar spine surgery for decompression of spinal cord   Surgeries: Procedure(s): Lumbar L4-5 decompression/disectomy on 04/03/2018    Consultants:   Discharged Condition: Improved  Hospital Course: Cory LewandowskyDennis Lenderman is an 52 y.o. male who was admitted 04/03/2018 with a chief complaint of No chief complaint on file. 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  They were brought to the operating room on 04/03/2018 and underwent Procedure(s): Lumbar L4-5 decompression/disectomy.    They were given perioperative antibiotics:  Anti-infectives (From admission, onward)   Start     Dose/Rate Route Frequency Ordered Stop   04/03/18 1800  ceFAZolin (ANCEF) IVPB 2g/100 mL premix     2 g 200 mL/hr over 30 Minutes Intravenous Every 8 hours 04/03/18 1140 04/04/18 0314    .  They were given sequential compression devices, early ambulation, and Other (comment) for DVT prophylaxis.  Recent vital signs:  Patient Vitals for the past 24 hrs:  BP Temp Temp src Pulse Resp SpO2  04/04/18 0751 126/84 98.5 F (36.9 C) Oral 75 (!) 28 98 %  04/04/18 0444 121/85 98.3 F (36.8 C) Oral 70 20 99 %  04/04/18 0004 116/76 98.1 F (36.7 C) Oral 84 (!) 22 98 %  04/03/18 1939 115/75 98.7 F (37.1 C) Oral 92 20 95 %  04/03/18 1649 111/75 97.7 F (36.5 C) Oral 80 18  98 %  04/03/18 1136 110/71 98.7 F (37.1 C) Oral (!) 104 20 94 %  04/03/18 1115 101/70 98.8 F (37.1 C) - (!) 113 14 94 %  04/03/18 1105 - - - (!) 116 19 92 %  04/03/18 1100 107/69 - - (!) 114 14 92 %  04/03/18 1055 - - - (!) 118 16 99 %  04/03/18 1045 102/82 - - (!) 121 (!) 28 99 %  04/03/18 1040 - - - (!) 114 14 98 %  04/03/18 1030 117/73 99.7 F (37.6 C) - (!) 123 16 100 %    Recent laboratory studies: Dg Lumbar Spine 2-3 Views  Result Date: 04/03/2018 CLINICAL DATA:  Lumbar decompression. EXAM: LUMBAR SPINE - 2-3 VIEW COMPARISON:  Lumbar MRI 09/20/2014 FINDINGS: Two intraoperative cross table lateral radiographs of the lumbar spine are provided. Needles project over the L3 and L4 spinous processes on the first image. On the second image, a surgical probe projects over the L4 posterior elements directed towards the L4-5 disc space. Tissue retractors are in place posteriorly. Prior L5-S1 disc arthroplasty is noted. IMPRESSION: Intraoperative localization as above. Electronically Signed   By: Sebastian AcheAllen  Grady M.D.   On: 04/03/2018 08:47    Discharge Medications:   Allergies as of 04/04/2018      Reactions   Benadryl [diphenhydramine Hcl (sleep)] Other (See Comments)   Restless legs  Medication List    STOP taking these medications   ibuprofen 200 MG tablet Commonly known as:  ADVIL,MOTRIN     TAKE these medications   atorvastatin 40 MG tablet Commonly known as:  LIPITOR Take 40 mg by mouth at bedtime.   guaiFENesin 600 MG 12 hr tablet Commonly known as:  MUCINEX Take 600 mg by mouth daily as needed (congestion).   losartan 50 MG tablet Commonly known as:  COZAAR Take 50 mg by mouth at bedtime.   metFORMIN 500 MG tablet Commonly known as:  GLUCOPHAGE Take 500 mg by mouth 2 (two) times daily.   methocarbamol 500 MG tablet Commonly known as:  ROBAXIN Take 1 tablet (500 mg total) by mouth 3 (three) times daily.   multivitamin with minerals Tabs tablet Take 1 tablet  by mouth daily.   omeprazole 20 MG capsule Commonly known as:  PRILOSEC Take 20 mg by mouth daily as needed (acid reflux).   ondansetron 4 MG disintegrating tablet Commonly known as:  ZOFRAN ODT Take 1 tablet (4 mg total) by mouth every 8 (eight) hours as needed.   oxyCODONE-acetaminophen 10-325 MG tablet Commonly known as:  PERCOCET Take 1 tablet by mouth every 4 (four) hours as needed for up to 5 days for pain.   oxyCODONE-acetaminophen 10-325 MG tablet Commonly known as:  PERCOCET Take 1 tablet by mouth every 6 (six) hours as needed for pain.   pseudoephedrine 120 MG 12 hr tablet Commonly known as:  SUDAFED Take 120 mg by mouth daily as needed for congestion.   ranitidine 150 MG tablet Commonly known as:  ZANTAC Take 150 mg by mouth daily as needed for heartburn.   rOPINIRole 0.25 MG tablet Commonly known as:  REQUIP Take 0.25 mg by mouth at bedtime.   sertraline 100 MG tablet Commonly known as:  ZOLOFT Take 100 mg by mouth at bedtime.   SUMAtriptan 100 MG tablet Commonly known as:  IMITREX Take 100 mg by mouth every 2 (two) hours as needed for migraine or headache. May repeat in 2 hours if headache persists or recurs.   SYSTANE OP Place 1 drop into both eyes daily as needed (dry eyes).   testosterone cypionate 200 MG/ML injection Commonly known as:  DEPOTESTOSTERONE CYPIONATE Inject 200 mg into the muscle every 28 (twenty-eight) days.   Vitamin D 2000 units tablet Take 2,000 Units by mouth daily.       Diagnostic Studies: Dg Lumbar Spine 2-3 Views  Result Date: 04/03/2018 CLINICAL DATA:  Lumbar decompression. EXAM: LUMBAR SPINE - 2-3 VIEW COMPARISON:  Lumbar MRI 09/20/2014 FINDINGS: Two intraoperative cross table lateral radiographs of the lumbar spine are provided. Needles project over the L3 and L4 spinous processes on the first image. On the second image, a surgical probe projects over the L4 posterior elements directed towards the L4-5 disc space. Tissue  retractors are in place posteriorly. Prior L5-S1 disc arthroplasty is noted. IMPRESSION: Intraoperative localization as above. Electronically Signed   By: Sebastian Ache M.D.   On: 04/03/2018 08:47    They benefited maximally from their hospital stay and there were no complications.     Disposition: Discharge disposition: 01-Home or Self Care      Discharge Instructions    Incentive spirometry RT   Complete by:  As directed      Follow-up Information    Venita Lick, MD Follow up in 2 week(s).   Specialty:  Orthopedic Surgery Contact information: [address] [place] [address]  [phone]            Signed: Karma Greaser 04/04/2018, 9:06 AM

## 2018-04-04 NOTE — Progress Notes (Signed)
Physical Therapy Treatment Patient Details Name: Cory LewandowskyDennis Holland MRN: [id number] DOB: August 16, 1966 Today's Date: 04/04/2018    History of Present Illness Pt is a 52 y/o male s/p L4-5 decompression. PMH includes DM, HTN, and OSA on CPAP.     PT Comments    Pt progressing towards physical therapy goals. Was able to perform transfers and ambulation with gross supervision for safety. Pt was educated on brace application/wearing schedule, precautions, car transfer, and general safety with activity progression. Pt anticipates d/c home today. Will continue to follow and progress as able per POC.    Follow Up Recommendations  No PT follow up;Supervision for mobility/OOB     Equipment Recommendations  None recommended by PT    Recommendations for Other Services       Precautions / Restrictions Precautions Precautions: Back Precaution Booklet Issued: Yes (comment) Precaution Comments: Reviewed back precautions with pt.  Required Braces or Orthoses: Spinal Brace Spinal Brace: Lumbar corset;Applied in sitting position Restrictions Weight Bearing Restrictions: No    Mobility  Bed Mobility               General bed mobility comments: Pt sitting up on EOB when PT arrived.   Transfers Overall transfer level: Needs assistance Equipment used: None Transfers: Sit to/from Stand Sit to Stand: Supervision;Min guard         General transfer comment: Initially min guard provided for safety. By end of session pt at a light supervision level.   Ambulation/Gait Ambulation/Gait assistance: Min guard;Min assist Ambulation Distance (Feet): 400 Feet Assistive device: None Gait Pattern/deviations: Step-through pattern;Decreased stride length Gait velocity: Decreased  Gait velocity interpretation: <1.31 ft/sec, indicative of household ambulator General Gait Details: Slow but generally steady. Pt was cued for improved posture and abdominal bracing for added back support/pain control.     Stairs Stairs: Yes Stairs assistance: Min guard;Supervision Stair Management: One rail Left;Step to pattern;Forwards Number of Stairs: 10 General stair comments: VC's for sequencing and general safety.    Wheelchair Mobility    Modified Rankin (Stroke Patients Only)       Balance Overall balance assessment: Needs assistance Sitting-balance support: No upper extremity supported;Feet supported Sitting balance-Leahy Scale: Good     Standing balance support: Bilateral upper extremity supported;No upper extremity supported;During functional activity Standing balance-Leahy Scale: Fair Standing balance comment: Able to maintain static standing without UE support.                            Cognition Arousal/Alertness: Awake/alert Behavior During Therapy: WFL for tasks assessed/performed Overall Cognitive Status: Within Functional Limits for tasks assessed                                        Exercises      General Comments        Pertinent Vitals/Pain Pain Assessment: Faces Pain Score: 2  Faces Pain Scale: Hurts little more Pain Location: (back) Pain Descriptors / Indicators: Aching;Operative site guarding Pain Intervention(s): Monitored during session    Home Living Family/patient expects to be discharged to:: Private residence Living Arrangements: Spouse/significant other;Children Available Help at Discharge: Family;Available 24 hours/day Type of Home: House Home Access: Stairs to enter Entrance Stairs-Rails: Left Home Layout: One level Home Equipment: Walker - 2 wheels;Cane - single point;Shower seat;Bedside commode      Prior Function Level of Independence: Independent  PT Goals (current goals can now be found in the care plan section) Acute Rehab PT Goals Patient Stated Goal: Home today PT Goal Formulation: With patient Time For Goal Achievement: 04/17/18 Potential to Achieve Goals: Good Progress towards PT  goals: Progressing toward goals    Frequency    Min 5X/week      PT Plan Current plan remains appropriate    Co-evaluation              AM-PAC PT "6 Clicks" Daily Activity  Outcome Measure  Difficulty turning over in bed (including adjusting bedclothes, sheets and blankets)?: None Difficulty moving from lying on back to sitting on the side of the bed? : A Little Difficulty sitting down on and standing up from a chair with arms (e.g., wheelchair, bedside commode, etc,.)?: A Little Help needed moving to and from a bed to chair (including a wheelchair)?: A Little Help needed walking in hospital room?: A Little Help needed climbing 3-5 steps with a railing? : A Lot 6 Click Score: 18    End of Session Equipment Utilized During Treatment: Gait belt;Back brace Activity Tolerance: Patient tolerated treatment well Patient left: in bed;with call bell/phone within reach;with family/visitor present Nurse Communication: Mobility status PT Visit Diagnosis: Other abnormalities of gait and mobility (R26.89);Pain;Unsteadiness on feet (R26.81) Pain - part of body: (back )     Time: 1191-4782 PT Time Calculation (min) (ACUTE ONLY): 32 min  Charges:  $Gait Training: 23-37 mins                    G Codes:       Conni Slipper, PT, DPT Acute Rehabilitation Services Pager: [phone]    Marylynn Pearson 04/04/2018, 11:13 AM

## 2018-04-04 NOTE — Evaluation (Signed)
Occupational Therapy Evaluation Patient Details Name: Cory Holland MRN: [id number] DOB: 1966/11/02 Today's Date: 04/04/2018    History of Present Illness Pt is a 52 y/o male s/p L4-5 decompression. PMH includes DM, HTN, and OSA on CPAP.    Clinical Impression   PATIENT AND WIFE WERE EDUCATED ON BACK PRECAUTIONS AND HAVE BACK HANDOUT. PATIENT WAS ABLE TO PERFORM ADLS USING CROSS LEG TECHNIQUE FOR LE DRESSING. PATIENT AND WIFE WERE EDUCATED TO PURCHASE A FLEXABLE LONG HANDLED SPONGE FOR SHOWER. THEY WERE ALSO ADVISED TO USE 3-1 COMMODE TO RAISE THE COMMODE AT THEIR HOUSE HIGHER TO INCREASE PATIENT ABILITY WITH SIT TO STAND FROM COMMODE. PATIENT WAS EDUCATED AND WAS ABLE TO PERFORM SAFE TRANSFER TO COMMODE.     Follow Up Recommendations  No OT follow up    Equipment Recommendations  None recommended by OT    Recommendations for Other Services       Precautions / Restrictions Precautions Precautions: Back Precaution Comments: Reviewed back precautions with pt.  Required Braces or Orthoses: Spinal Brace Spinal Brace: Lumbar corset;Applied in sitting position Restrictions Weight Bearing Restrictions: No      Mobility Bed Mobility                  Transfers                      Balance                                           ADL either performed or assessed with clinical judgement   ADL Overall ADL's : Needs assistance/impaired         Upper Body Bathing: Supervision/ safety   Lower Body Bathing: Supervison/ safety   Upper Body Dressing : Supervision/safety   Lower Body Dressing: Supervision/safety   Toilet Transfer: Supervision/safety   Toileting- Clothing Manipulation and Hygiene: Supervision/safety       Functional mobility during ADLs: Supervision/safety General ADL Comments: PATIENT WAS EDUCATED ON BACK PRECAUTIONS AND PERFORMING ADLS AND ADL TRANSFERS.(PATIENT WAS EDUCATED ON PROPER TECHNIQUE FOR ADLS FOLLOW BAC)      Vision Baseline Vision/History: No visual deficits       Perception     Praxis      Pertinent Vitals/Pain Pain Assessment: 0-10 Pain Score: 2  Pain Location: (back) Pain Descriptors / Indicators: Aching;Operative site guarding Pain Intervention(s): Limited activity within patient's tolerance;Premedicated before session     Hand Dominance     Extremity/Trunk Assessment Upper Extremity Assessment Upper Extremity Assessment: Overall WFL for tasks assessed           Communication Communication Communication: No difficulties   Cognition Arousal/Alertness: Awake/alert Behavior During Therapy: WFL for tasks assessed/performed Overall Cognitive Status: Within Functional Limits for tasks assessed                                     General Comments       Exercises     Shoulder Instructions      Home Living Family/patient expects to be discharged to:: Private residence Living Arrangements: Spouse/significant other;Children Available Help at Discharge: Family;Available 24 hours/day Type of Home: House Home Access: Stairs to enter [employer] of Steps: 4 Entrance Stairs-Rails: Left Home Layout: One level     Bathroom Shower/Tub: Tub/shower unit  Bathroom Toilet: Standard     Home Equipment: Environmental consultantWalker - 2 wheels;Cane - single point;Shower seat;Bedside commode          Prior Functioning/Environment Level of Independence: Independent                 OT Problem List:        OT Treatment/Interventions:      OT Goals(Current goals can be found in the care plan section) Acute Rehab OT Goals Patient Stated Goal: GO HOME TODAY  OT Frequency:     Barriers to D/C:            Co-evaluation              AM-PAC PT "6 Clicks" Daily Activity     Outcome Measure Help from another person eating meals?: None Help from another person taking care of personal grooming?: None Help from another person toileting, which  includes using toliet, bedpan, or urinal?: None Help from another person bathing (including washing, rinsing, drying)?: None Help from another person to put on and taking off regular upper body clothing?: None Help from another person to put on and taking off regular lower body clothing?: None 6 Click Score: 24   End of Session Equipment Utilized During Treatment: Gait belt  Activity Tolerance: Patient tolerated treatment well Patient left: in chair;with call bell/phone within reach;with family/visitor present                   Time: 0922-0948 OT Time Calculation (min): 26 min Charges:  OT General Charges $OT Visit: 1 Visit OT Evaluation $OT Eval Low Complexity: 1 Low OT Treatments $Self Care/Home Management : 8-22 mins G-Codes:   6 CLICKS 24  Cory Holland 04/04/2018, 10:08 AM

## 2018-04-04 NOTE — Progress Notes (Signed)
Patient is discharged from room 3C11 at this time. Alert and in stable condition. IV site d/c'd and instructions read to patient and spouse with understanding verbalized. Left unit via wheelchair with all belongings at side. 

## 2018-10-24 NOTE — Pre-Procedure Instructions (Signed)
[facility]  10/24/2018      [facility] - [place], [place] - [address] [place] [address] Phone: [phone] Fax: [phone]    Your procedure is scheduled on Wednesday November 20.  Report to [facility] Admitting at 12:00 A.M.  Call this number if you have problems the morning of surgery:  [phone]   Remember:  Do not eat or drink after midnight.    Take these medicines the morning of surgery with A SIP OF WATER:   Omeprazole (prilosec) Ranitidine (Zantac) if needed  DO NOT TAKE Metformin (Glucophage) the day of surgery  7 days prior to surgery STOP taking any Aspirin(unless otherwise instructed by your surgeon), Aleve, Naproxen, Ibuprofen, Motrin, Advil, Goody's, BC's, all herbal medications, fish oil, and all vitamins     How to Manage Your Diabetes Before and After Surgery  Why is it important to control my blood sugar before and after surgery? . Improving blood sugar levels before and after surgery helps healing and can limit problems. . A way of improving blood sugar control is eating a healthy diet by: o  Eating less sugar and carbohydrates o  Increasing activity/exercise o  Talking with your doctor about reaching your blood sugar goals . High blood sugars (greater than 180 mg/dL) can raise your risk of infections and slow your recovery, so you will need to focus on controlling your diabetes during the weeks before surgery. . Make sure that the doctor who takes care of your diabetes knows about your planned surgery including the date and location.  How do I manage my blood sugar before surgery? . Check your blood sugar at least 4 times a day, starting 2 days before surgery, to make sure that the level is not too high or low. o Check your blood sugar the morning of your surgery when you wake up and every 2 hours until you get to the Short Stay unit. . If your blood sugar is less than 70 mg/dL, you will  need to treat for low blood sugar: o Do not take insulin. o Treat a low blood sugar (less than 70 mg/dL) with  cup of clear juice (cranberry or apple), 4 glucose tablets, OR glucose gel. Recheck blood sugar in 15 minutes after treatment (to make sure it is greater than 70 mg/dL). If your blood sugar is not greater than 70 mg/dL on recheck, call [phone] o  for further instructions. . Report your blood sugar to the short stay nurse when you get to Short Stay.  . If you are admitted to the hospital after surgery: o Your blood sugar will be checked by the staff and you will probably be given insulin after surgery (instead of oral diabetes medicines) to make sure you have good blood sugar levels. o The goal for blood sugar control after surgery is 80-180 mg/dL.                Do not wear jewelry  Do not wear lotions, powders, or colognes, or deodorant.  Do not shave 48 hours prior to surgery.  Men may shave face and neck.  Do not bring valuables to the hospital.  [facility] is not responsible for any belongings or valuables.  Contacts, dentures or bridgework may not be worn into surgery.  Leave your suitcase in the car.  After surgery it may be brought to your room.  For patients admitted to the  hospital, discharge time will be determined by your treatment team.  Patients discharged the day of surgery will not be allowed to drive home.   Special instructions:    [facility]- Preparing For Surgery  Before surgery, you can play an important role. Because skin is not sterile, your skin needs to be as free of germs as possible. You can reduce the number of germs on your skin by washing with CHG (chlorahexidine gluconate) Soap before surgery.  CHG is an antiseptic cleaner which kills germs and bonds with the skin to continue killing germs even after washing.    Oral Hygiene is also important to reduce your risk of infection.  Remember - BRUSH YOUR TEETH THE MORNING OF SURGERY  WITH YOUR REGULAR TOOTHPASTE  Please do not use if you have an allergy to CHG or antibacterial soaps. If your skin becomes reddened/irritated stop using the CHG.  Do not shave (including legs and underarms) for at least 48 hours prior to first CHG shower. It is OK to shave your face.  Please follow these instructions carefully.   1. Shower the NIGHT BEFORE SURGERY and the MORNING OF SURGERY with CHG.   2. If you chose to wash your hair, wash your hair first as usual with your normal shampoo.  3. After you shampoo, rinse your hair and body thoroughly to remove the shampoo.  4. Use CHG as you would any other liquid soap. You can apply CHG directly to the skin and wash gently with a scrungie or a clean washcloth.   5. Apply the CHG Soap to your body ONLY FROM THE NECK DOWN.  Do not use on open wounds or open sores. Avoid contact with your eyes, ears, mouth and genitals (private parts). Wash Face and genitals (private parts)  with your normal soap.  6. Wash thoroughly, paying special attention to the area where your surgery will be performed.  7. Thoroughly rinse your body with warm water from the neck down.  8. DO NOT shower/wash with your normal soap after using and rinsing off the CHG Soap.  9. Pat yourself dry with a CLEAN TOWEL.  10. Wear CLEAN PAJAMAS to bed the night before surgery, wear comfortable clothes the morning of surgery  11. Place CLEAN SHEETS on your bed the night of your first shower and DO NOT SLEEP WITH PETS.    Day of Surgery:  Do not apply any deodorants/lotions.  Please wear clean clothes to the hospital/surgery center.   Remember to brush your teeth WITH YOUR REGULAR TOOTHPASTE.    Please read over the following fact sheets that you were given. Coughing and Deep Breathing, MRSA Information and Surgical Site Infection Prevention

## 2018-10-27 ENCOUNTER — Encounter (HOSPITAL_COMMUNITY)
Admission: RE | Admit: 2018-10-27 | Discharge: 2018-10-27 | Disposition: A | Discharge status: Home / Self Care | Payer: BLUE CROSS/BLUE SHIELD | Source: Ambulatory Visit | Attending: Orthopedic Surgery | Admitting: Orthopedic Surgery

## 2018-10-27 ENCOUNTER — Encounter (HOSPITAL_COMMUNITY): Payer: Self-pay

## 2018-10-27 ENCOUNTER — Other Ambulatory Visit: Payer: Self-pay

## 2018-10-27 ENCOUNTER — Ambulatory Visit (HOSPITAL_COMMUNITY): Payer: Self-pay | Admitting: Orthopedic Surgery

## 2018-10-27 DIAGNOSIS — M5136 Other intervertebral disc degeneration, lumbar region: Secondary | ICD-10-CM | POA: Diagnosis not present

## 2018-10-27 DIAGNOSIS — M961 Postlaminectomy syndrome, not elsewhere classified: Secondary | ICD-10-CM | POA: Diagnosis not present

## 2018-10-27 DIAGNOSIS — Z01818 Encounter for other preprocedural examination: Secondary | ICD-10-CM | POA: Diagnosis not present

## 2018-10-27 HISTORY — DX: Pneumonia, unspecified organism: J18.9

## 2018-10-27 LAB — BASIC METABOLIC PANEL
Anion gap: 8 (ref 5–15)
BUN: 17 mg/dL (ref 6–20)
CO2: 28 mmol/L (ref 22–32)
CREATININE: 1.28 mg/dL — AB (ref 0.61–1.24)
Calcium: 9.7 mg/dL (ref 8.9–10.3)
Chloride: 101 mmol/L (ref 98–111)
GFR calc Af Amer: >60 mL/min (ref >60)
GLUCOSE: 289 mg/dL — AB (ref 70–99)
POTASSIUM: 4 mmol/L (ref 3.5–5.1)
Sodium: 137 mmol/L (ref 135–145)

## 2018-10-27 LAB — CBC
HCT: 40.9 % (ref 39.0–52.0)
Hemoglobin: 13.2 g/dL (ref 13.0–17.0)
MCH: 28.6 pg (ref 26.0–34.0)
MCHC: 32.3 g/dL (ref 30.0–36.0)
MCV: 88.5 fL (ref 80.0–100.0)
PLATELETS: 222 10*3/uL (ref 150–400)
RBC: 4.62 MIL/uL (ref 4.22–5.81)
RDW: 12.7 % (ref 11.5–15.5)
WBC: 5.8 10*3/uL (ref 4.0–10.5)
nRBC: 0 % (ref 0.0–0.2)

## 2018-10-27 LAB — HEMOGLOBIN A1C
Hgb A1c MFr Bld: 7.9 % — ABNORMAL HIGH (ref 4.8–5.6)
Mean Plasma Glucose: 180.03 mg/dL

## 2018-10-27 LAB — SURGICAL PCR SCREEN
MRSA, PCR: NEGATIVE
Staphylococcus aureus: NEGATIVE

## 2018-10-27 LAB — TYPE AND SCREEN
ABO/RH(D): A POS
ANTIBODY SCREEN: NEGATIVE

## 2018-10-27 LAB — GLUCOSE, CAPILLARY: GLUCOSE-CAPILLARY: 322 mg/dL — AB (ref 70–99)

## 2018-10-27 NOTE — H&P (Signed)
HPI The patient is here today for a pre-operative History and Physical. They are scheduled for XLIF L4-5 day 1 and PSFI L4-S1 day 2 on 11-05-18 and 11-06-18 with Dr. Shon Baton at [facility].  Allergies DIPHENHYDRAMINE HCL: Other (Moderate to severe)   Medications Reviewed Medications anastrozole 1 mg tablet qd atorvastatin 40 mg tablet Take 1 tablet(s) every day by oral route. Ciprofloxacin 500 mg tablet clomiPHENE citrate 50 mg tablet Daily Multi-Vitamin Imitrex 100 mg tablet losartan 50 mg tablet metFORMIN 500 mg tablet mupirocin 2 % topical ointment naproxen 500 mg tablet rOPINIRole 0.5 mg tablet sertraline 100 mg tablet testosterone cypionate 200 mg/mL intramuscular oil Vitamin D3 50 mcg (2,000 unit) capsule  Problems Reviewed Problems Neuritis AND/OR radiculitis due to displacement of lumbar intervertebral disc - Onset: 10/03/2018 Degeneration of spine - Onset: 10/03/2018 Lumbar spondylosis - Onset: 10/03/2018 Lumbar radiculopathy - Onset: 01/30/2018 Lumbar post-laminectomy syndrome - Onset: 01/30/2018 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 - Onset: 01/13/2018 - L4-5 Herniation of nucleus pulposus - Onset: 01/30/2018 Lumbar discogenic pain - Onset: 01/13/2018 History of Spinal surgery - Onset: 12/27/2017 Degeneration of lumbar intervertebral disc - Onset: 12/27/2017  Family History Reviewed Family History Father - No current problems or disability Mother - No current problems or disability  Social History Reviewed Social History Tobacco Smoking Status: Never smoker Most Recent Tobacco Use Screening: 06/24/2018  Surgical History Reviewed Surgical History Eye surgery procedure - 12/17/2016 Hernia Repair - 12/17/2016 Carpal Tunnel Release - 12/17/2013 - left Lumbar spine fusion - 12/17/2013 Carpal Tunnel Release - 12/18/2011 - right  Past Medical History Reviewed Past Medical History Diabetes: Y GERD/Reflux: Y Headaches:  Y Hypertension: Y   ROS:  CONSTITUTIONAL: no Fever, no Weight Gain, no Weight Loss  HEAD, EARS, EYES, NOSE, AND THROAT: no Dry Eyes, no Eye Irritation, no Vision Changes, no Sore Throat  CARDIOVASCULAR: no Chest Pain / Angina, no Palpitations, no Poor Circulation, no Leg Swelling  RESPIRATORY: no Cough, no Shortness of Breath, no COPD, no Asthma  GASTROINTESTINAL: no Nausea / Vomiting, no Black Stools / Blood in Stool, no Diarrhea, no Vomiting Blood  GENITOURINARY: no Blood in Urine, no Difficulty Urinating  MUSCULOSKELETAL: no Joint Pain, no Joint Swelling, no Rheumatoid Arthritis, no Lost 2 or more inches in Height  SKIN: no Rash, no Varicose Veins  NEUROLOGIC: no Numbness, no Seizures, no Dizziness, no Problems with Balance  PSYCHIATRIC: no Anxiety, no Depression  ENDOCRINE: no Heat Intolerance  BLOOD / LYMPH: no Anemia, no Bleed Easily, no Bruise Easily, no Swollen Glands ROS as noted in the HPI  Physical Exam Patient is a 52 year old male.  Clinical exam: Cory Holland returns today following L4-5 microdiscectomy for right radicular L5 leg pain (5-1/2 months postop) Pain level/location: He continues to have significant back pain which is adversely affecting his quality of life. He still requires medications because of his back pain primarily. He does get occasional radicular leg pain but his biggest problem is his back pain. Neuro exam: Negative nerve root tension signs in the lower extremity. 5 out of 5 motor strength in the lower extremity bilaterally. Intermittent dysesthesias primarily in the buttock and hamstring. Negative Babinski test, no clonus. Intact sensation to light touch in the lower extremities. Patient is ambulating with: No assistive devices. But he does have increased back pain with prolonged ambulation, or when he first arises out of a seated position. Incision: Well-healed with no signs of infection Compartments in the lower extremity are: Soft and  nontender Abdomen soft  and nontender. Normal bowel and bladder function. No shortness of breath or chest pain. Patient is alert and oriented 3 Lungs: Clear to auscultation.  Heart: No rubs gallops murmurs, regular rate and rhythm.  Lumbar MRI: completed on 08/07/18 was reviewed with the patient. I have also reviewed the radiology report. Moderate disc bulge at L4-5 with superimposed central extrusion asymmetric to the right which appears to be somewhat greater than his preoperative MRI showed. Moderate facet arthropathy. No evidence of stenosis at L5-S1. No significant pathology L1-2 through L3-4  Assessment / Plan Diagnosis: Cory Holland returns today for follow-up after his lumbar epidural injection. Unfortunately his quality of life continues to deteriorate primarily because of his low back pain. The epidural injection as well as the physical therapy has not been helpful in restoring his quality-of-life secondary to the ongoing back pain. He does not have significant radicular leg pain and so I do think from the standpoint of the microdiscectomy we were successful in addressing the neuropathic leg pain. Unfortunately despite conservative management he continues to deteriorate.  We are now 5 years status post the L5-S1 disc replacement, and 6 months status post L4-5 discectomy. At this point in order to address the postlaminectomy syndrome, degenerative disc disease at L4-5 I would need to do a lateral interbody fusion at L4-5 with posterior pedicle screw fixation from L4-S1. I do not think that a fusion at L4-5 along with be beneficial to him. The resulting stress at the L5-S1 disc arthroplasty would more than likely lead to ongoing pain and loss of quality of life. I am also hesitant to recommend resection of the disc as this would compromise a significant amount of bone and lead to a more extensive reconstructive procedure. Treatment plan: I have gone over this with the patient and his daughter and I  have asked him to talk with his wife and to contact me if he would like to proceed. We have also gone over the risks and benefits of the lateral surgery. The goal of surgery and thereby improving his quality-of-life I have stressed to him that it is a reduction not elimination.  Risks, benefits of surgery were reviewed with the patient. These include: infection, bleeding, death, stroke, paralysis, ongoing or worse pain, need for additional surgery, injury to the lumbar plexus resulting in hip flexor weakness and difficulty walking without assistive devices. Adjacent segment degenerative disease, need for additional surgery including fusing other levels, leak of spinal fluid, Nonunion, hardware failure, breakage, or mal-position. Deep venous thrombosis (DVT) requiring additional treatment such as filter, and/or medications. Injury to abdominal contents, loss in bowel and bladder control.  All of his and his wife's questions were addressed. Plan on moving forward with surgery in a staged fashion next week 11/20 and 21.

## 2018-10-27 NOTE — Progress Notes (Signed)
PCP -  Bettye Boeck MD  Chest x-ray -  N/A EKG - 02/2018 Requested  CPAP - yes, bringing mask and hose  Fasting Blood Sugar - 230-240s Checks Blood Sugar: checks randomly  Blood Thinner Instructions: N/A Aspirin Instructions: N/A  Anesthesia review: yes, requested docs. Elevated CBG   Patient denies shortness of breath, fever, cough and chest pain at PAT appointment   Patient verbalized understanding of instructions that were given to them at the PAT appointment. Patient was also instructed that they will need to review over the PAT instructions again at home before surgery.

## 2018-10-27 NOTE — H&P (Deleted)
  The note originally documented on this encounter has been moved the the encounter in which it belongs.  

## 2018-10-28 NOTE — Progress Notes (Signed)
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    DISCUSSION: Patient is a 52 year old male scheduled for the above procedure. He is s/p L4-5 decompression/discectomy 04/03/18. S/p L5-S1 disc replacement 05/19/14.  History includes HTN, DM2, OSA (CPAP), GERD, exertional dyspnea (documented 05/13/14), never smoker. BMI is consistent with obesity. -  Patient was treated for acute prostatitis 10/16/18 by [facility] [place], [place], [place]). Urine culture grew beta-hemolytic strep group B, > 100,000 colonies. He was prescribed a 2 week course of Cipro.  Patient's A1c was 7.9 (average glucose 180), but up from 6.4 (average 137) on 08/14/18. He is on metformin BID. He does not check home CBGs often, but has had at least some fasting in the 230-240 range (which is higher than what I'd expect even with an A1c of 7.9, unless he is eating in the middle of the night). His PAT glucose (after lunch) was 289. He is currently being treated for acute prostatitis, but symptomatically is improving and will be done with antibiotics prior to his surgery. This could have driven his sugars up recently. He is on weekly testosterone, but says this is not a new medication change, otherwise does not think that he's had any recent steroids. He denied fever. He denied known cardiac history. No denied SOB at rest, cough, fever, chest pain. He tries to stay as active as he can, but some limitations due to back and leg pain.   Message left with Cordelia Pen at Dr. Shon Baton' office regarding treatment for acute  prostatitis and elevated A1c/home CBGs. Patient says he will be more consistent with checking his home CBGs. I've asked him to notify his PCP is fasting CBGs are consistently > 200 because a fasting CBG much over 200 on the day of surgery could potentially delay or cancel his surgery; However, unless acute changes or significant hyperglycemia on the day of surgery, then I would anticipate that he could proceed with surgeries from an anesthesia standpoint.    VS: BP 127/76   Pulse 64   Temp 36.8 C   Resp 20   Ht 5\' 8"  (1.727 m)   Wt 108.1 kg   SpO2 100%   BMI 36.23 kg/m    PROVIDERS: Carolan Clines, PA is PCP [facility]; [facility]).  Valinda Hoar, MD is urologist [facility])   LABS: Preoperative labs noted. CBC WNL. Cr 1.28. Non-fasting glucose 289. A1c 7.9 (up from A1c 6.4 on 08/14/18 and 7.1 on 05/14/18 at [facility]). (all labs ordered are listed, but only abnormal results are displayed)  Labs Reviewed  GLUCOSE, CAPILLARY - Abnormal; Notable for the following components:      Result Value   Glucose-Capillary 322 (*)    All other components within normal limits  BASIC METABOLIC PANEL - Abnormal; Notable for the following components:   Glucose, Bld 289 (*)    Creatinine, Ser 1.28 (*)    All other components within normal limits  HEMOGLOBIN A1C - Abnormal; Notable for the following components:   Hgb A1c  MFr Bld 7.9 (*)    All other components within normal limits  SURGICAL PCR SCREEN  CBC  TYPE AND SCREEN    EKG: EKG 02/24/18 (PCP office; scanned under Media tab, Correspondence, 04/03/18): Sinus rhythm.  RSR in V1, nondiagnostic   CV:  He reported a stress test [facility] > 5 years ago. (This [facility], but medical records did not have records of a stress test there.)   Past Medical History:  Diagnosis Date  . Diabetes mellitus without complication (HCC)   . DJD (degenerative  joint disease)   . Family history of adverse reaction to anesthesia    "mom gets sick"  . GERD (gastroesophageal reflux disease)   . Headache(784.0)    migraines  . History of kidney stones   . Hypertension   . Pneumonia   . Shortness of breath    with exertion  . Sleep apnea    CPAP 5    Past Surgical History:  Procedure Laterality Date  . ABDOMINAL EXPOSURE N/A 05/19/2014   Procedure: ABDOMINAL EXPOSURE;  Surgeon: Larina Earthlyodd F Early, MD;  [facility];  Service: Vascular;  Laterality: N/A;  . CARPAL TUNNEL RELEASE Bilateral   . COLONOSCOPY WITH ESOPHAGOGASTRODUODENOSCOPY (EGD)    . CORNEA LACERATION REPAIR Left   . fatty tumor Left    left side neck  . HERNIA REPAIR    . LUMBAR DISC ARTHROPLASTY N/A 05/19/2014   Procedure: TOTAL DISC REPLACEMENT L5-S1  (1 LEVEL);  Surgeon: Venita Lickahari Brooks, MD;  [facility];  Service: Orthopedics;  Laterality: N/A;  . LUMBAR LAMINECTOMY/DECOMPRESSION MICRODISCECTOMY N/A 04/03/2018   Procedure: Lumbar L4-5 decompression/disectomy;  Surgeon: Venita LickBrooks, Dahari, MD;  Location: [facility] OR;  Service: Orthopedics;  Laterality: N/A;  120 mins  . PATELLAR TENDON REPAIR Right   . TONSILLECTOMY      MEDICATIONS: . anastrozole (ARIMIDEX) 1 MG tablet  . atorvastatin (LIPITOR) 40 MG tablet  . Cholecalciferol (VITAMIN D) 2000 units tablet  . ciprofloxacin (CIPRO) 500 MG tablet  . losartan (COZAAR) 100 MG tablet  . metFORMIN (GLUCOPHAGE) 500 MG tablet  . methocarbamol (ROBAXIN) 500 MG tablet  . Multiple Vitamin (MULTIVITAMIN WITH MINERALS) TABS tablet  . naproxen (NAPROSYN) 500 MG tablet  . omeprazole (PRILOSEC) 20 MG capsule  . ondansetron (ZOFRAN ODT) 4 MG disintegrating tablet  . oxyCODONE-acetaminophen (PERCOCET) 10-325 MG tablet  . Polyethyl Glycol-Propyl Glycol (SYSTANE OP)  . ranitidine (ZANTAC) 150 MG tablet  . rOPINIRole (REQUIP) 0.5 MG tablet  . sertraline (ZOLOFT) 100 MG tablet  . SUMAtriptan (IMITREX) 100 MG tablet  . testosterone cypionate  (DEPOTESTOTERONE CYPIONATE) 200 MG/ML injection   No current facility-administered medications for this encounter.   (He is on Arimidex per urology due to low T, on testosterone, with some breast enlargement.)   Velna Ochsllison Shatiqua Heroux, PA-C [facility] Short Stay [facility]/Anesthesiology Phone [phone] 10/28/2018 12:16 PM

## 2018-10-28 NOTE — Anesthesia Preprocedure Evaluation (Addendum)
Anesthesia Evaluation  Patient identified by MRN, date of birth, ID band Patient awake    Reviewed: Allergy & Precautions, NPO status , Patient's Chart, lab work & pertinent test results  History of Anesthesia Complications Negative for: history of anesthetic complications  Airway Mallampati: III  TM Distance: >3 FB Neck ROM: Full    Dental no notable dental hx. (+) Dental Advisory Given   Pulmonary sleep apnea and Continuous Positive Airway Pressure Ventilation ,    Pulmonary exam normal        Cardiovascular hypertension, Pt. on medications Normal cardiovascular exam     Neuro/Psych negative neurological ROS  negative psych ROS   GI/Hepatic Neg liver ROS, GERD  Medicated,  Endo/Other  diabetesMorbid obesity  Renal/GU negative Renal ROS     Musculoskeletal   Abdominal   Peds  Hematology   Anesthesia Other Findings   Reproductive/Obstetrics                                                            Anesthesia Evaluation  Patient identified by MRN, date of birth, ID band Patient awake    Reviewed: Allergy & Precautions, H&P , NPO status , Patient's Chart, lab work & pertinent test results  History of Anesthesia Complications (+) Family history of anesthesia reactionNegative for: history of anesthetic complications  Airway Mallampati: II  TM Distance: >3 FB Neck ROM: Full    Dental  (+) Teeth Intact, Dental Advisory Given   Pulmonary sleep apnea and Continuous Positive Airway Pressure Ventilation ,    Pulmonary exam normal        Cardiovascular hypertension, Pt. on medications Normal cardiovascular exam  EKG 02/24/18  Sinus rhythm.  RSR in V1 nondiagnostic      Neuro/Psych negative neurological ROS  negative psych ROS   GI/Hepatic Neg liver ROS, GERD  Medicated,  Endo/Other  diabetes, Type 2, Oral Hypoglycemic Agents  Renal/GU negative Renal ROS   negative genitourinary   Musculoskeletal  (+) Arthritis ,   Abdominal   Peds  Hematology   Anesthesia Other Findings   Reproductive/Obstetrics negative OB ROS                            Anesthesia Physical  Anesthesia Plan  ASA: II  Anesthesia Plan: General   Post-op Pain Management:    Induction: Intravenous  PONV Risk Score and Plan: 1 and Ondansetron and Treatment may vary due to age or medical condition  Airway Management Planned: Oral ETT  Additional Equipment:   Intra-op Plan:   Post-operative Plan: Extubation in OR  Informed Consent: I have reviewed the patients History and Physical, chart, labs and discussed the procedure including the risks, benefits and alternatives for the proposed anesthesia with the patient or authorized representative who has indicated his/her understanding and acceptance.   Dental advisory given  Plan Discussed with: CRNA, Anesthesiologist and Surgeon  Anesthesia Plan Comments:        Anesthesia Quick Evaluation  Anesthesia Physical Anesthesia Plan  ASA: III  Anesthesia Plan: General   Post-op Pain Management:    Induction: Intravenous  PONV Risk Score and Plan: 4 or greater and Ondansetron, Dexamethasone, Scopolamine patch - Pre-op and Treatment may vary due to age or medical condition  Airway Management  Planned: Oral ETT  Additional Equipment:   Intra-op Plan:   Post-operative Plan: Extubation in OR  Informed Consent: I have reviewed the patients History and Physical, chart, labs and discussed the procedure including the risks, benefits and alternatives for the proposed anesthesia with the patient or authorized representative who has indicated his/her understanding and acceptance.   Dental advisory given  Plan Discussed with: CRNA and Anesthesiologist  Anesthesia Plan Comments: (PAT note written 10/28/2018 by Shonna ChockAllison Zelenak, PA-C. )     Anesthesia Quick Evaluation

## 2018-11-05 ENCOUNTER — Inpatient Hospital Stay (HOSPITAL_COMMUNITY)
Admission: RE | Admit: 2018-11-05 | Discharge: 2018-11-08 | DRG: 454 | Disposition: A | Discharge status: Home / Self Care | Payer: BLUE CROSS/BLUE SHIELD | Attending: Orthopedic Surgery | Admitting: Orthopedic Surgery

## 2018-11-05 ENCOUNTER — Inpatient Hospital Stay (HOSPITAL_COMMUNITY): Payer: BLUE CROSS/BLUE SHIELD | Admitting: Anesthesiology

## 2018-11-05 ENCOUNTER — Other Ambulatory Visit: Payer: Self-pay

## 2018-11-05 ENCOUNTER — Inpatient Hospital Stay (HOSPITAL_COMMUNITY): Payer: BLUE CROSS/BLUE SHIELD | Admitting: Physician Assistant

## 2018-11-05 ENCOUNTER — Inpatient Hospital Stay (HOSPITAL_COMMUNITY): Payer: BLUE CROSS/BLUE SHIELD

## 2018-11-05 ENCOUNTER — Inpatient Hospital Stay (HOSPITAL_COMMUNITY)
Admission: RE | Disposition: A | Discharge status: Home / Self Care | Payer: Self-pay | Source: Home / Self Care | Attending: Orthopedic Surgery

## 2018-11-05 ENCOUNTER — Encounter (HOSPITAL_COMMUNITY): Payer: Self-pay | Admitting: *Deleted

## 2018-11-05 DIAGNOSIS — K219 Gastro-esophageal reflux disease without esophagitis: Secondary | ICD-10-CM | POA: Diagnosis present

## 2018-11-05 DIAGNOSIS — M545 Low back pain, unspecified: Secondary | ICD-10-CM | POA: Diagnosis present

## 2018-11-05 DIAGNOSIS — M5116 Intervertebral disc disorders with radiculopathy, lumbar region: Secondary | ICD-10-CM | POA: Diagnosis present

## 2018-11-05 DIAGNOSIS — Z419 Encounter for procedure for purposes other than remedying health state, unspecified: Secondary | ICD-10-CM

## 2018-11-05 DIAGNOSIS — Z79890 Hormone replacement therapy (postmenopausal): Secondary | ICD-10-CM

## 2018-11-05 DIAGNOSIS — Z79899 Other long term (current) drug therapy: Secondary | ICD-10-CM

## 2018-11-05 DIAGNOSIS — M961 Postlaminectomy syndrome, not elsewhere classified: Principal | ICD-10-CM | POA: Diagnosis present

## 2018-11-05 DIAGNOSIS — Z6836 Body mass index (BMI) 36.0-36.9, adult: Secondary | ICD-10-CM

## 2018-11-05 DIAGNOSIS — Z7984 Long term (current) use of oral hypoglycemic drugs: Secondary | ICD-10-CM

## 2018-11-05 DIAGNOSIS — Z888 Allergy status to other drugs, medicaments and biological substances status: Secondary | ICD-10-CM

## 2018-11-05 DIAGNOSIS — G4730 Sleep apnea, unspecified: Secondary | ICD-10-CM | POA: Diagnosis present

## 2018-11-05 DIAGNOSIS — Z87442 Personal history of urinary calculi: Secondary | ICD-10-CM

## 2018-11-05 DIAGNOSIS — G9520 Unspecified cord compression: Secondary | ICD-10-CM | POA: Diagnosis present

## 2018-11-05 DIAGNOSIS — E119 Type 2 diabetes mellitus without complications: Secondary | ICD-10-CM | POA: Diagnosis present

## 2018-11-05 DIAGNOSIS — M4327 Fusion of spine, lumbosacral region: Secondary | ICD-10-CM | POA: Diagnosis present

## 2018-11-05 DIAGNOSIS — I10 Essential (primary) hypertension: Secondary | ICD-10-CM | POA: Diagnosis present

## 2018-11-05 HISTORY — PX: ANTERIOR LATERAL LUMBAR FUSION WITH PERCUTANEOUS SCREW 1 LEVEL: SHX5553

## 2018-11-05 LAB — GLUCOSE, CAPILLARY
GLUCOSE-CAPILLARY: 131 mg/dL — AB (ref 70–99)
GLUCOSE-CAPILLARY: 291 mg/dL — AB (ref 70–99)
Glucose-Capillary: 133 mg/dL — ABNORMAL HIGH (ref 70–99)
Glucose-Capillary: 161 mg/dL — ABNORMAL HIGH (ref 70–99)

## 2018-11-05 SURGERY — ANTERIOR LATERAL LUMBAR FUSION WITH PERCUTANEOUS SCREW 1 LEVEL
Anesthesia: General | Site: Back

## 2018-11-05 MED ORDER — LIDOCAINE 2% (20 MG/ML) 5 ML SYRINGE
INTRAMUSCULAR | Status: AC
  Filled 2018-11-05: qty 5

## 2018-11-05 MED ORDER — HYDROMORPHONE HCL 1 MG/ML IJ SOLN
INTRAMUSCULAR | Status: AC
  Administered 2018-11-05: 0.5 mg via INTRAVENOUS
  Filled 2018-11-05: qty 1

## 2018-11-05 MED ORDER — BUPIVACAINE-EPINEPHRINE (PF) 0.25% -1:200000 IJ SOLN
INTRAMUSCULAR | Status: AC
  Filled 2018-11-05: qty 30

## 2018-11-05 MED ORDER — SUCCINYLCHOLINE CHLORIDE 200 MG/10ML IV SOSY
PREFILLED_SYRINGE | INTRAVENOUS | Status: AC
  Filled 2018-11-05: qty 10

## 2018-11-05 MED ORDER — CEFAZOLIN SODIUM-DEXTROSE 2-4 GM/100ML-% IV SOLN
2.0000 g | Freq: Three times a day (TID) | INTRAVENOUS | Status: AC
  Administered 2018-11-05 – 2018-11-06 (×2): 2 g via INTRAVENOUS
  Filled 2018-11-05 (×2): qty 100

## 2018-11-05 MED ORDER — OXYCODONE HCL 5 MG PO TABS
10.0000 mg | ORAL_TABLET | ORAL | Status: DC | PRN
  Administered 2018-11-05 – 2018-11-06 (×3): 10 mg via ORAL
  Filled 2018-11-05 (×2): qty 2

## 2018-11-05 MED ORDER — INSULIN ASPART 100 UNIT/ML ~~LOC~~ SOLN
0.0000 [IU] | Freq: Every day | SUBCUTANEOUS | Status: DC
  Administered 2018-11-05: 3 [IU] via SUBCUTANEOUS

## 2018-11-05 MED ORDER — ROPINIROLE HCL 1 MG PO TABS
0.5000 mg | ORAL_TABLET | Freq: Every day | ORAL | Status: DC
  Administered 2018-11-05 – 2018-11-07 (×3): 0.5 mg via ORAL
  Filled 2018-11-05 (×3): qty 1

## 2018-11-05 MED ORDER — SUFENTANIL CITRATE 50 MCG/ML IV SOLN
INTRAVENOUS | Status: DC | PRN
  Administered 2018-11-05 (×2): 10 ug via INTRAVENOUS
  Administered 2018-11-05: 5 ug via INTRAVENOUS
  Administered 2018-11-05: 15 ug via INTRAVENOUS

## 2018-11-05 MED ORDER — PROPOFOL 10 MG/ML IV BOLUS
INTRAVENOUS | Status: AC
  Filled 2018-11-05: qty 20

## 2018-11-05 MED ORDER — DOCUSATE SODIUM 100 MG PO CAPS
100.0000 mg | ORAL_CAPSULE | Freq: Two times a day (BID) | ORAL | Status: DC
  Administered 2018-11-05 – 2018-11-07 (×4): 100 mg via ORAL
  Filled 2018-11-05 (×4): qty 1

## 2018-11-05 MED ORDER — SUFENTANIL CITRATE 50 MCG/ML IV SOLN
INTRAVENOUS | Status: AC
  Filled 2018-11-05: qty 1

## 2018-11-05 MED ORDER — PHENYLEPHRINE HCL 10 MG/ML IJ SOLN
INTRAMUSCULAR | Status: DC | PRN
  Administered 2018-11-05 (×2): 80 ug via INTRAVENOUS

## 2018-11-05 MED ORDER — DEXAMETHASONE SODIUM PHOSPHATE 10 MG/ML IJ SOLN
INTRAMUSCULAR | Status: DC | PRN
  Administered 2018-11-05: 10 mg via INTRAVENOUS

## 2018-11-05 MED ORDER — SERTRALINE HCL 50 MG PO TABS
100.0000 mg | ORAL_TABLET | Freq: Every day | ORAL | Status: DC
  Administered 2018-11-05 – 2018-11-07 (×3): 100 mg via ORAL
  Filled 2018-11-05 (×3): qty 2

## 2018-11-05 MED ORDER — ACETAMINOPHEN 650 MG RE SUPP: 650.0000 mg | RECTAL | Status: DC | PRN

## 2018-11-05 MED ORDER — CEFAZOLIN SODIUM-DEXTROSE 2-4 GM/100ML-% IV SOLN
2.0000 g | INTRAVENOUS | Status: AC
  Administered 2018-11-05: 2 g via INTRAVENOUS
  Filled 2018-11-05: qty 100

## 2018-11-05 MED ORDER — HYDROMORPHONE HCL 1 MG/ML IJ SOLN
0.2500 mg | INTRAMUSCULAR | Status: DC | PRN
  Administered 2018-11-05 (×4): 0.5 mg via INTRAVENOUS

## 2018-11-05 MED ORDER — METFORMIN HCL 500 MG PO TABS
500.0000 mg | ORAL_TABLET | Freq: Two times a day (BID) | ORAL | Status: DC
  Administered 2018-11-06 – 2018-11-08 (×4): 500 mg via ORAL
  Filled 2018-11-05 (×4): qty 1

## 2018-11-05 MED ORDER — EPHEDRINE SULFATE 50 MG/ML IJ SOLN
INTRAMUSCULAR | Status: DC | PRN
  Administered 2018-11-05: 15 mg via INTRAVENOUS

## 2018-11-05 MED ORDER — BUPIVACAINE-EPINEPHRINE 0.25% -1:200000 IJ SOLN
INTRAMUSCULAR | Status: DC | PRN
  Administered 2018-11-05: 15 mL

## 2018-11-05 MED ORDER — ATORVASTATIN CALCIUM 10 MG PO TABS
40.0000 mg | ORAL_TABLET | Freq: Every day | ORAL | Status: DC
  Administered 2018-11-05 – 2018-11-07 (×3): 40 mg via ORAL
  Filled 2018-11-05 (×3): qty 4

## 2018-11-05 MED ORDER — LOSARTAN POTASSIUM 50 MG PO TABS
100.0000 mg | ORAL_TABLET | Freq: Every day | ORAL | Status: DC
  Administered 2018-11-05 – 2018-11-07 (×3): 100 mg via ORAL
  Filled 2018-11-05 (×3): qty 2

## 2018-11-05 MED ORDER — OXYCODONE HCL 5 MG PO TABS: 5.0000 mg | ORAL_TABLET | ORAL | Status: DC | PRN

## 2018-11-05 MED ORDER — 0.9 % SODIUM CHLORIDE (POUR BTL) OPTIME
TOPICAL | Status: DC | PRN
  Administered 2018-11-05 (×2): 1000 mL

## 2018-11-05 MED ORDER — EPHEDRINE 5 MG/ML INJ
INTRAVENOUS | Status: AC
  Filled 2018-11-05: qty 10

## 2018-11-05 MED ORDER — MIDAZOLAM HCL 2 MG/2ML IJ SOLN
INTRAMUSCULAR | Status: AC
  Filled 2018-11-05: qty 2

## 2018-11-05 MED ORDER — SUCCINYLCHOLINE CHLORIDE 20 MG/ML IJ SOLN
INTRAMUSCULAR | Status: DC | PRN
  Administered 2018-11-05: 90 mg via INTRAVENOUS

## 2018-11-05 MED ORDER — POLYETHYLENE GLYCOL 3350 17 G PO PACK: 17.0000 g | PACK | Freq: Every day | ORAL | Status: DC | PRN

## 2018-11-05 MED ORDER — MENTHOL 3 MG MT LOZG: 1.0000 | LOZENGE | OROMUCOSAL | Status: DC | PRN

## 2018-11-05 MED ORDER — LIDOCAINE 2% (20 MG/ML) 5 ML SYRINGE
INTRAMUSCULAR | Status: DC | PRN
  Administered 2018-11-05: 80 mg via INTRAVENOUS

## 2018-11-05 MED ORDER — HYDROMORPHONE HCL 1 MG/ML IJ SOLN: 1.0000 mg | INTRAMUSCULAR | Status: DC | PRN

## 2018-11-05 MED ORDER — INSULIN ASPART 100 UNIT/ML ~~LOC~~ SOLN
0.0000 [IU] | Freq: Three times a day (TID) | SUBCUTANEOUS | Status: DC
  Administered 2018-11-06: 11 [IU] via SUBCUTANEOUS
  Administered 2018-11-07: 3 [IU] via SUBCUTANEOUS
  Administered 2018-11-07: 2 [IU] via SUBCUTANEOUS
  Administered 2018-11-07: 3 [IU] via SUBCUTANEOUS
  Administered 2018-11-08: 5 [IU] via SUBCUTANEOUS

## 2018-11-05 MED ORDER — PHENOL 1.4 % MT LIQD: 1.0000 | OROMUCOSAL | Status: DC | PRN

## 2018-11-05 MED ORDER — SCOPOLAMINE 1 MG/3DAYS TD PT72
1.0000 | MEDICATED_PATCH | TRANSDERMAL | Status: DC
  Administered 2018-11-05: 1.5 mg via TRANSDERMAL
  Filled 2018-11-05: qty 1

## 2018-11-05 MED ORDER — PHENYLEPHRINE 40 MCG/ML (10ML) SYRINGE FOR IV PUSH (FOR BLOOD PRESSURE SUPPORT)
PREFILLED_SYRINGE | INTRAVENOUS | Status: AC
  Filled 2018-11-05: qty 10

## 2018-11-05 MED ORDER — ONDANSETRON HCL 4 MG/2ML IJ SOLN
INTRAMUSCULAR | Status: DC | PRN
  Administered 2018-11-05: 4 mg via INTRAVENOUS

## 2018-11-05 MED ORDER — METHOCARBAMOL 500 MG PO TABS
ORAL_TABLET | ORAL | Status: AC
  Filled 2018-11-05: qty 1

## 2018-11-05 MED ORDER — PROPOFOL 10 MG/ML IV BOLUS
INTRAVENOUS | Status: DC | PRN
  Administered 2018-11-05: 140 mg via INTRAVENOUS
  Administered 2018-11-05: 40 mg via INTRAVENOUS
  Administered 2018-11-05: 20 mg via INTRAVENOUS

## 2018-11-05 MED ORDER — KETAMINE HCL 10 MG/ML IJ SOLN
INTRAMUSCULAR | Status: DC | PRN
  Administered 2018-11-05: 40 mg via INTRAVENOUS
  Administered 2018-11-05: 10 mg via INTRAVENOUS

## 2018-11-05 MED ORDER — METHOCARBAMOL 1000 MG/10ML IJ SOLN
500.0000 mg | Freq: Four times a day (QID) | INTRAVENOUS | Status: DC | PRN
  Filled 2018-11-05: qty 5

## 2018-11-05 MED ORDER — SODIUM CHLORIDE 0.9% FLUSH: 3.0000 mL | INTRAVENOUS | Status: DC | PRN

## 2018-11-05 MED ORDER — ACETAMINOPHEN 325 MG PO TABS: 650.0000 mg | ORAL_TABLET | ORAL | Status: DC | PRN

## 2018-11-05 MED ORDER — ANASTROZOLE 1 MG PO TABS
1.0000 mg | ORAL_TABLET | Freq: Every day | ORAL | Status: DC
  Administered 2018-11-05 – 2018-11-07 (×3): 1 mg via ORAL
  Filled 2018-11-05 (×3): qty 1

## 2018-11-05 MED ORDER — HEMOSTATIC AGENTS (NO CHARGE) OPTIME
TOPICAL | Status: DC | PRN
  Administered 2018-11-05: 1

## 2018-11-05 MED ORDER — MIDAZOLAM HCL 5 MG/5ML IJ SOLN
INTRAMUSCULAR | Status: DC | PRN
  Administered 2018-11-05: 2 mg via INTRAVENOUS

## 2018-11-05 MED ORDER — SODIUM CHLORIDE 0.9% FLUSH: 3.0000 mL | Freq: Two times a day (BID) | INTRAVENOUS | Status: DC

## 2018-11-05 MED ORDER — DEXAMETHASONE SODIUM PHOSPHATE 10 MG/ML IJ SOLN
INTRAMUSCULAR | Status: AC
  Filled 2018-11-05: qty 1

## 2018-11-05 MED ORDER — THROMBIN 20000 UNITS EX SOLR
CUTANEOUS | Status: DC | PRN
  Administered 2018-11-05: 20000 [IU] via TOPICAL

## 2018-11-05 MED ORDER — THROMBIN (RECOMBINANT) 20000 UNITS EX SOLR
CUTANEOUS | Status: AC
  Filled 2018-11-05: qty 20000

## 2018-11-05 MED ORDER — GABAPENTIN 300 MG PO CAPS
300.0000 mg | ORAL_CAPSULE | Freq: Three times a day (TID) | ORAL | Status: DC
  Administered 2018-11-05: 300 mg via ORAL
  Filled 2018-11-05: qty 1

## 2018-11-05 MED ORDER — PROMETHAZINE HCL 25 MG/ML IJ SOLN: 6.2500 mg | INTRAMUSCULAR | Status: DC | PRN

## 2018-11-05 MED ORDER — SODIUM CHLORIDE 0.9 % IV SOLN: 250.0000 mL | INTRAVENOUS | Status: DC

## 2018-11-05 MED ORDER — SODIUM CHLORIDE 0.9 % IV SOLN
INTRAVENOUS | Status: DC | PRN
  Administered 2018-11-05: 25 ug/min via INTRAVENOUS

## 2018-11-05 MED ORDER — PROPOFOL 500 MG/50ML IV EMUL
INTRAVENOUS | Status: DC | PRN
  Administered 2018-11-05: 50 ug/kg/min via INTRAVENOUS

## 2018-11-05 MED ORDER — ONDANSETRON HCL 4 MG/2ML IJ SOLN: 4.0000 mg | Freq: Four times a day (QID) | INTRAMUSCULAR | Status: DC | PRN

## 2018-11-05 MED ORDER — KETAMINE HCL 50 MG/5ML IJ SOSY
PREFILLED_SYRINGE | INTRAMUSCULAR | Status: AC
  Filled 2018-11-05: qty 5

## 2018-11-05 MED ORDER — LACTATED RINGERS IV SOLN: INTRAVENOUS | Status: DC

## 2018-11-05 MED ORDER — LACTATED RINGERS IV SOLN
INTRAVENOUS | Status: DC
  Administered 2018-11-05 (×2): via INTRAVENOUS

## 2018-11-05 MED ORDER — ONDANSETRON HCL 4 MG PO TABS: 4.0000 mg | ORAL_TABLET | Freq: Four times a day (QID) | ORAL | Status: DC | PRN

## 2018-11-05 MED ORDER — ONDANSETRON HCL 4 MG/2ML IJ SOLN
INTRAMUSCULAR | Status: AC
  Filled 2018-11-05: qty 2

## 2018-11-05 MED ORDER — OXYCODONE HCL 5 MG PO TABS
ORAL_TABLET | ORAL | Status: AC
  Filled 2018-11-05: qty 2

## 2018-11-05 MED ORDER — METHOCARBAMOL 500 MG PO TABS
500.0000 mg | ORAL_TABLET | Freq: Four times a day (QID) | ORAL | Status: DC | PRN
  Administered 2018-11-05 – 2018-11-06 (×2): 500 mg via ORAL
  Filled 2018-11-05: qty 1

## 2018-11-05 SURGICAL SUPPLY — 82 items
APPLIER CLIP 11 MED OPEN (CLIP)
BLADE CLIPPER SURG (BLADE) IMPLANT
BLADE SURG 10 STRL SS (BLADE) ×3 IMPLANT
BONE MATRIX OSTEOCEL PRO MED (Bone Implant) ×3 IMPLANT
BONE VIVIGEN FORMABLE 5.4CC (Bone Implant) ×3 IMPLANT
CLIP APPLIE 11 MED OPEN (CLIP) IMPLANT
CLOSURE STERI-STRIP 1/2X4 (GAUZE/BANDAGES/DRESSINGS) ×1
CLOSURE WOUND 1/2 X4 (GAUZE/BANDAGES/DRESSINGS)
CLSR STERI-STRIP ANTIMIC 1/2X4 (GAUZE/BANDAGES/DRESSINGS) ×2 IMPLANT
CORDS BIPOLAR (ELECTRODE) ×3 IMPLANT
COVER SURGICAL LIGHT HANDLE (MISCELLANEOUS) ×3 IMPLANT
COVER WAND RF STERILE (DRAPES) ×3 IMPLANT
DERMABOND ADVANCED (GAUZE/BANDAGES/DRESSINGS) ×2
DERMABOND ADVANCED .7 DNX12 (GAUZE/BANDAGES/DRESSINGS) ×1 IMPLANT
DRAPE C-ARM 42X72 X-RAY (DRAPES) ×3 IMPLANT
DRAPE ORTHO SPLIT 77X108 STRL (DRAPES) ×2
DRAPE POUCH INSTRU U-SHP 10X18 (DRAPES) ×3 IMPLANT
DRAPE SURG ORHT 6 SPLT 77X108 (DRAPES) ×1 IMPLANT
DRAPE U-SHAPE 47X51 STRL (DRAPES) ×6 IMPLANT
DRSG AQUACEL AG ADV 3.5X 6 (GAUZE/BANDAGES/DRESSINGS) ×3 IMPLANT
DRSG OPSITE POSTOP 3X4 (GAUZE/BANDAGES/DRESSINGS) ×3 IMPLANT
DRSG OPSITE POSTOP 4X6 (GAUZE/BANDAGES/DRESSINGS) ×3 IMPLANT
DURAPREP 26ML APPLICATOR (WOUND CARE) ×3 IMPLANT
ELECT BLADE 4.0 EZ CLEAN MEGAD (MISCELLANEOUS) ×3
ELECT CAUTERY BLADE 6.4 (BLADE) ×3 IMPLANT
ELECT PENCIL ROCKER SW 15FT (MISCELLANEOUS) ×3 IMPLANT
ELECT REM PT RETURN 9FT ADLT (ELECTROSURGICAL) ×3
ELECTRODE BLDE 4.0 EZ CLN MEGD (MISCELLANEOUS) ×1 IMPLANT
ELECTRODE REM PT RTRN 9FT ADLT (ELECTROSURGICAL) ×1 IMPLANT
GAUZE 4X4 16PLY RFD (DISPOSABLE) ×3 IMPLANT
GLOVE BIO SURGEON STRL SZ 6.5 (GLOVE) ×2 IMPLANT
GLOVE BIO SURGEONS STRL SZ 6.5 (GLOVE) ×1
GLOVE BIOGEL PI IND STRL 6.5 (GLOVE) ×1 IMPLANT
GLOVE BIOGEL PI IND STRL 8.5 (GLOVE) ×1 IMPLANT
GLOVE BIOGEL PI INDICATOR 6.5 (GLOVE) ×2
GLOVE BIOGEL PI INDICATOR 8.5 (GLOVE) ×2
GLOVE SS BIOGEL STRL SZ 8.5 (GLOVE) ×1 IMPLANT
GLOVE SUPERSENSE BIOGEL SZ 8.5 (GLOVE) ×2
GOWN STRL REUS W/ TWL LRG LVL3 (GOWN DISPOSABLE) ×1 IMPLANT
GOWN STRL REUS W/TWL 2XL LVL3 (GOWN DISPOSABLE) ×6 IMPLANT
GOWN STRL REUS W/TWL LRG LVL3 (GOWN DISPOSABLE) ×2
KIT BASIN OR (CUSTOM PROCEDURE TRAY) ×3 IMPLANT
KIT DILATOR XLIF 5 (KITS) ×2 IMPLANT
KIT SURGICAL ACCESS MAXCESS 4 (KITS) ×3 IMPLANT
KIT TURNOVER KIT B (KITS) ×3 IMPLANT
KIT XLIF (KITS) ×1
MODULE EMG NEEDLE SSEP NVM5 (NEEDLE) ×3 IMPLANT
MODULE NVM5 NEXT GEN EMG (NEEDLE) ×3 IMPLANT
MODULUS XLW 12X22X55MM 10 (Spine Construct) ×3 IMPLANT
NEEDLE 22X1 1/2 (OR ONLY) (NEEDLE) ×3 IMPLANT
NEEDLE I-PASS III (NEEDLE) IMPLANT
NEEDLE SPNL 18GX3.5 QUINCKE PK (NEEDLE) ×3 IMPLANT
NS IRRIG 1000ML POUR BTL (IV SOLUTION) ×3 IMPLANT
PACK LAMINECTOMY ORTHO (CUSTOM PROCEDURE TRAY) ×3 IMPLANT
PACK UNIVERSAL I (CUSTOM PROCEDURE TRAY) ×3 IMPLANT
PAD ARMBOARD 7.5X6 YLW CONV (MISCELLANEOUS) ×6 IMPLANT
SPONGE INTESTINAL PEANUT (DISPOSABLE) ×6 IMPLANT
SPONGE LAP 4X18 RFD (DISPOSABLE) ×3 IMPLANT
SPONGE SURGIFOAM ABS GEL 100 (HEMOSTASIS) ×3 IMPLANT
STAPLER VISISTAT 35W (STAPLE) ×3 IMPLANT
STRIP CLOSURE SKIN 1/2X4 (GAUZE/BANDAGES/DRESSINGS) IMPLANT
SURGIFLO W/THROMBIN 8M KIT (HEMOSTASIS) ×3 IMPLANT
SUT BONE WAX W31G (SUTURE) ×3 IMPLANT
SUT MON AB 3-0 SH 27 (SUTURE) ×2
SUT MON AB 3-0 SH27 (SUTURE) ×1 IMPLANT
SUT PROLENE 5 0 C 1 24 (SUTURE) IMPLANT
SUT SILK 2 0 TIES 10X30 (SUTURE) ×3 IMPLANT
SUT SILK 3 0 TIES 10X30 (SUTURE) ×3 IMPLANT
SUT VIC AB 1 CT1 27 (SUTURE) ×4
SUT VIC AB 1 CT1 27XBRD ANBCTR (SUTURE) ×2 IMPLANT
SUT VIC AB 1 CTX 36 (SUTURE) ×4
SUT VIC AB 1 CTX36XBRD ANBCTR (SUTURE) ×2 IMPLANT
SUT VIC AB 2-0 CT1 18 (SUTURE) ×3 IMPLANT
SYR BULB IRRIGATION 50ML (SYRINGE) ×3 IMPLANT
SYR CONTROL 10ML LL (SYRINGE) ×3 IMPLANT
TAPE CLOTH 4X10 WHT NS (GAUZE/BANDAGES/DRESSINGS) ×6 IMPLANT
TOWEL OR 17X24 6PK STRL BLUE (TOWEL DISPOSABLE) ×3 IMPLANT
TOWEL OR 17X26 10 PK STRL BLUE (TOWEL DISPOSABLE) ×3 IMPLANT
TRAY FOLEY CATH SILVER 16FR (SET/KITS/TRAYS/PACK) ×3 IMPLANT
TRAY FOLEY W/BAG SLVR 16FR (SET/KITS/TRAYS/PACK) ×2
TRAY FOLEY W/BAG SLVR 16FR ST (SET/KITS/TRAYS/PACK) ×1 IMPLANT
WATER STERILE IRR 1000ML POUR (IV SOLUTION) ×3 IMPLANT

## 2018-11-05 NOTE — Brief Op Note (Signed)
11/05/2018  5:38 PM  PATIENT:  Cory Holland  52 y.o. 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

## 2018-11-05 NOTE — Op Note (Signed)
Operative procedure  Preoperative diagnosis: Discogenic back pain with radiculopathy L4-5.  Postlaminectomy syndrome L4-5  Postoperative diagnosis: Same  Operative procedure: Lateral interbody fusion L4-5  Complications: None  Intraoperative neuro monitoring: No adverse free running EMGs.  No adverse activity with SSEP and evoked motor potentials.  Implants:Nuvasive.  Titanium 3D lateral cage.  This is a 12 x 22 x 55.  10 degree lordosis.  Retractor time: 20 minutes  Indications: Maurine MinisterDennis is a very pleasant 52 year old gentleman who had an L5-S1 total disc arthroplasty several years ago and then recently had a L4-5 lumbar laminotomy and discectomy.  Unfortunately he continues to have significant back pain as well as intermittent radicular right leg pain.  Imaging studies demonstrate degenerative disc disease L4-5.  Patient had failed conservative management and elected to move forward with definitive fixation and treatment of his discogenic back pain.  The surgical plan on day 1 was a lateral interbody fusion at L4-5.  Tomorrow we will return to the OR for a posterior pedicle screw fixation L4-S1 with posterior lateral arthrodesis.  If he is having ongoing significant radicular leg pain then we can consider a laminotomy and direct decompression.  Patient and his wife were made aware of the 2-day surgical plan.  All their questions were addressed and consent was obtained.  All appropriate risks benefits and alternatives were discussed.  Operative report patient was brought the operating room placed by the operating room table.  After successful induction of general anesthesia and endotracheal ablation teds SCDs and a Foley were inserted.  The neuro monitoring rep then attached all appropriate pads and needles for intraoperative SSEP and evoked motor potential monitoring as well as free running EMGs.  Once this was completed the patient was turned into the right lateral decubitus position (right side  up) axillary roll was placed, and all bony prominences well-padded.  The right arm was placed into the arm holder and the patient was secured to the table with tape.  I confirmed in both the AP and lateral plane with fluoroscopy that I had excellent visualization of the L4-5 disc space.  Once the patient was properly centered to the disc space was perpendicular to the forearm the lateral flank was prepped and draped in a standard fashion.  Timeout was taken to confirm patient procedure and all other important data.  Once this was done I then marked on the surface the anterior and posterior margins of the L4-5 disc space and then infiltrated the skin incision with quarter percent Marcaine with epinephrine.  A lateral 3 inch incision was made and I sharply dissected through the adipose tissue down to the surface of the external oblique.  A second small incision was made 1 fingerbreadth posterior to this 1 and I bluntly dissected down to the posterior aspect of the retroperitoneal fascia I bluntly dissected through the retroperitoneal fascia and I manually dissected the area to mobilize the adipose tissue.  I could feel the surface of the iliopsoas muscle.  I then bluntly dissected through the undersurface of the external oblique until I can visualize my finger in the initial incision site.  Once this was done I placed the first dilating portal on my finger and brought it down to the surface of the psoas muscle.  I then stimulated the surface of the psoas muscle to ensure I was not traumatizing the lumbar plexus.  I confirmed with lateral fluoroscopy that I was in the anterior space and then traversed the psoas muscle to the lateral  surface of the disc.  I then stimulated circumferentially to ensure that the lumbar plexus was not being traumatized.  Once this was done I then sequentially dilated up and with each dilatation I stimulated circumferentially to ensure the plexus was not traumatized.  Once the final dilator  was in place I then placed the working trocar.  I then gently mobilized the trocar posteriorly.  I made sure to keep the cuff of iliopsoas muscle thick so that I would not traumatize the lumbar plexus.  As I mobilized the trocar posteriorly I could see more more of the lateral aspect of the disc space.  Once I had an adequate amount of the disc space visualized I secured the trocar to the bed with the strong arm.  X-rays confirmed satisfactory position of the working trocar in both the AP and lateral planes.  I then stimulated posterior to the working trocar to ensure that the lumbar plexus was not being traumatized.  Confirming there were no free running EMGs and that I was not traumatizing the plexus I secured the working trocar into the disc space with the retractor blade.  Once I had an adequate visualization of the lateral aspect of the disc space I then restarted with my discectomy.  An annulotomy was performed with a 10 blade scalpel and then I used the Cobb elevator to release the disc material from the endplate.  I then used a box osteotome to remove the disc in the bulk.  Using curettes and pituitary rongeurs I removed all of the disc material.  I also scraped the endplates to remove the cartilaginous portion and expose the bleeding subchondral bone.  I also made sure to release the contralateral annulus.  Once the discectomy was complete I then started using my trial devices.  Starting with the 8 parallel by 22 trial I proceeded up to the 12 parallel.  This provided the best fit.  It also provided the best indirect foraminal decompression and restoration of the disc height.  At this point the titanium cage was obtained and packed with the allograft.  I irrigated the wound copiously normal saline and then malleted the cage into the intervertebral space.  I had excellent fixation.  X-rays in both the AP and lateral planes confirmed satisfactory length and disc height restoration.  The working trocar  was gently removed after obtained hemostasis using bipolar cautery and FloSeal.  All wounds were copiously irrigated with normal saline.  Final x-rays were taken which were satisfactory.  Both wounds were closed in a layered fashion with interrupted #1 Vicryl suture, 2-0 Vicryl suture, and 3-0 Monocryl.  Steri-Strips dry dressing were applied and the patient was ultimately extubated transfer the PACU that incident.  The end of the case all needle sponge counts were correct.  There are no adverse intraoperative events.

## 2018-11-05 NOTE — Anesthesia Procedure Notes (Signed)
Procedure Name: Intubation Date/Time: 11/05/2018 2:59 PM Performed by: Eligha Bridegroom, CRNA Pre-anesthesia Checklist: Patient identified, Emergency Drugs available, Suction available, Patient being monitored and Timeout performed Patient Re-evaluated:Patient Re-evaluated prior to induction Oxygen Delivery Method: Circle system utilized Preoxygenation: Pre-oxygenation with 100% oxygen Induction Type: IV induction Ventilation: Mask ventilation without difficulty and Oral airway inserted - appropriate to patient size Laryngoscope Size: Mac and 4 Grade View: Grade II Tube type: Oral Tube size: 7.5 mm Number of attempts: 1 Airway Equipment and Method: Stylet Placement Confirmation: ETT inserted through vocal cords under direct vision,  positive ETCO2 and breath sounds checked- equal and bilateral Secured at: 21 cm Tube secured with: Tape Dental Injury: Teeth and Oropharynx as per pre-operative assessment

## 2018-11-05 NOTE — Anesthesia Postprocedure Evaluation (Signed)
Anesthesia Post Note  Patient: Cory Holland  Procedure(s) Performed: ANTERIOR LATERAL LUMBAR FUSION L4-5 (N/A Back)     Patient location during evaluation: PACU Anesthesia Type: General Level of consciousness: awake and alert Pain management: pain level controlled Vital Signs Assessment: post-procedure vital signs reviewed and stable Respiratory status: spontaneous breathing, nonlabored ventilation and respiratory function stable Cardiovascular status: blood pressure returned to baseline and stable Postop Assessment: no apparent nausea or vomiting Anesthetic complications: no    Last Vitals:  Vitals:   11/05/18 1750 11/05/18 1805  BP: 97/66 109/80  Pulse: (!) 101 (!) 112  Resp: (!) 5 (!) 5  Temp:    SpO2: 90% 94%    Last Pain:  Vitals:   11/05/18 1756  TempSrc:   PainSc: 7                  Cory Holland,W. EDMOND

## 2018-11-05 NOTE — Transfer of Care (Signed)
Immediate Anesthesia Transfer of Care Note  Patient: Cory Holland  Procedure(s) Performed: ANTERIOR LATERAL LUMBAR FUSION L4-5 (N/A Back)  Patient Location: PACU  Anesthesia Type:General  Level of Consciousness: awake, alert  and oriented  Airway & Oxygen Therapy: Patient Spontanous Breathing and Patient connected to nasal cannula oxygen  Post-op Assessment: Report given to RN and Post -op Vital signs reviewed and stable  Post vital signs: Reviewed and stable  Last Vitals:  Vitals Value Taken Time  BP 131/84 11/05/2018  5:37 PM  Temp 36.2 C 11/05/2018  5:35 PM  Pulse 113 11/05/2018  5:41 PM  Resp 32 11/05/2018  5:41 PM  SpO2 88 % 11/05/2018  5:41 PM  Vitals shown include unvalidated device data.  Last Pain:  Vitals:   11/05/18 1735  TempSrc:   PainSc: 0-No pain      Patients Stated Pain Goal: 0 (11/05/18 1243)  Complications: No apparent anesthesia complications

## 2018-11-05 NOTE — Anesthesia Preprocedure Evaluation (Addendum)
Anesthesia Evaluation  Patient identified by MRN, date of birth, ID band Patient awake    Reviewed: Allergy & Precautions, H&P , NPO status , Patient's Chart, lab work & pertinent test results  Airway Mallampati: III  TM Distance: >3 FB Neck ROM: Full    Dental no notable dental hx. (+) Teeth Intact, Dental Advisory Given   Pulmonary sleep apnea and Continuous Positive Airway Pressure Ventilation ,    Pulmonary exam normal breath sounds clear to auscultation       Cardiovascular Exercise Tolerance: Good hypertension,  Rhythm:Regular Rate:Normal     Neuro/Psych  Headaches, negative psych ROS   GI/Hepatic Neg liver ROS, GERD  Medicated and Controlled,  Endo/Other  diabetes, Type 2, Oral Hypoglycemic AgentsMorbid obesity  Renal/GU negative Renal ROS  negative genitourinary   Musculoskeletal  (+) Arthritis , Osteoarthritis,    Abdominal   Peds  Hematology negative hematology ROS (+)   Anesthesia Other Findings   Reproductive/Obstetrics negative OB ROS                            Anesthesia Physical Anesthesia Plan  ASA: III  Anesthesia Plan: General   Post-op Pain Management:    Induction: Intravenous  PONV Risk Score and Plan: 3 and Ondansetron, Dexamethasone and Midazolam  Airway Management Planned: Oral ETT and Video Laryngoscope Planned  Additional Equipment:   Intra-op Plan:   Post-operative Plan: Extubation in OR  Informed Consent: I have reviewed the patients History and Physical, chart, labs and discussed the procedure including the risks, benefits and alternatives for the proposed anesthesia with the patient or authorized representative who has indicated his/her understanding and acceptance.   Dental advisory given  Plan Discussed with: CRNA  Anesthesia Plan Comments:        Anesthesia Quick Evaluation

## 2018-11-06 ENCOUNTER — Inpatient Hospital Stay (HOSPITAL_COMMUNITY)
Admission: RE | Admit: 2018-11-06 | Payer: BLUE CROSS/BLUE SHIELD | Source: Ambulatory Visit | Admitting: Orthopedic Surgery

## 2018-11-06 ENCOUNTER — Encounter (HOSPITAL_COMMUNITY): Payer: Self-pay | Admitting: Certified Registered Nurse Anesthetist

## 2018-11-06 ENCOUNTER — Inpatient Hospital Stay (HOSPITAL_COMMUNITY): Payer: BLUE CROSS/BLUE SHIELD | Admitting: Anesthesiology

## 2018-11-06 ENCOUNTER — Inpatient Hospital Stay (HOSPITAL_COMMUNITY): Payer: BLUE CROSS/BLUE SHIELD

## 2018-11-06 ENCOUNTER — Encounter (HOSPITAL_COMMUNITY)
Admission: RE | Disposition: A | Discharge status: Home / Self Care | Payer: Self-pay | Source: Home / Self Care | Attending: Orthopedic Surgery

## 2018-11-06 DIAGNOSIS — M4327 Fusion of spine, lumbosacral region: Secondary | ICD-10-CM | POA: Diagnosis present

## 2018-11-06 LAB — GLUCOSE, CAPILLARY
GLUCOSE-CAPILLARY: 311 mg/dL — AB (ref 70–99)
Glucose-Capillary: 222 mg/dL — ABNORMAL HIGH (ref 70–99)
Glucose-Capillary: 181 mg/dL — ABNORMAL HIGH (ref 70–99)
Glucose-Capillary: 157 mg/dL — ABNORMAL HIGH (ref 70–99)

## 2018-11-06 SURGERY — POSTERIOR LUMBAR FUSION 2 LEVEL
Anesthesia: General | Site: Back

## 2018-11-06 MED ORDER — DEXAMETHASONE SODIUM PHOSPHATE 10 MG/ML IJ SOLN
INTRAMUSCULAR | Status: AC
  Filled 2018-11-06: qty 1

## 2018-11-06 MED ORDER — PANTOPRAZOLE SODIUM 40 MG PO TBEC
40.0000 mg | DELAYED_RELEASE_TABLET | Freq: Every day | ORAL | Status: DC
  Administered 2018-11-06 – 2018-11-07 (×2): 40 mg via ORAL
  Filled 2018-11-06 (×2): qty 1

## 2018-11-06 MED ORDER — OXYCODONE HCL 5 MG PO TABS
5.0000 mg | ORAL_TABLET | ORAL | Status: DC | PRN
  Administered 2018-11-07 (×2): 5 mg via ORAL
  Filled 2018-11-06: qty 1

## 2018-11-06 MED ORDER — ONDANSETRON HCL 4 MG PO TABS: 4.0000 mg | ORAL_TABLET | Freq: Four times a day (QID) | ORAL | Status: DC | PRN

## 2018-11-06 MED ORDER — THROMBIN 20000 UNITS EX SOLR
CUTANEOUS | Status: DC | PRN
  Administered 2018-11-06: 20 mL via TOPICAL

## 2018-11-06 MED ORDER — HYDROMORPHONE HCL 1 MG/ML IJ SOLN
INTRAMUSCULAR | Status: AC
  Administered 2018-11-06: 0.5 mg via INTRAVENOUS
  Filled 2018-11-06: qty 1

## 2018-11-06 MED ORDER — GLYCOPYRROLATE PF 0.2 MG/ML IJ SOSY
PREFILLED_SYRINGE | INTRAMUSCULAR | Status: AC
  Filled 2018-11-06: qty 1

## 2018-11-06 MED ORDER — DEXAMETHASONE SODIUM PHOSPHATE 4 MG/ML IJ SOLN
4.0000 mg | Freq: Four times a day (QID) | INTRAMUSCULAR | Status: AC
  Administered 2018-11-06 – 2018-11-07 (×4): 4 mg via INTRAVENOUS
  Filled 2018-11-06 (×4): qty 1

## 2018-11-06 MED ORDER — LIDOCAINE HCL (CARDIAC) PF 100 MG/5ML IV SOSY
PREFILLED_SYRINGE | INTRAVENOUS | Status: DC | PRN
  Administered 2018-11-06: 60 mg via INTRAVENOUS

## 2018-11-06 MED ORDER — THROMBIN (RECOMBINANT) 20000 UNITS EX SOLR
CUTANEOUS | Status: AC
  Filled 2018-11-06: qty 20000

## 2018-11-06 MED ORDER — GABAPENTIN 400 MG PO CAPS
400.0000 mg | ORAL_CAPSULE | Freq: Three times a day (TID) | ORAL | Status: DC
  Administered 2018-11-06 – 2018-11-07 (×5): 400 mg via ORAL
  Filled 2018-11-06 (×5): qty 1

## 2018-11-06 MED ORDER — PHENYLEPHRINE HCL 10 MG/ML IJ SOLN
INTRAMUSCULAR | Status: DC | PRN
  Administered 2018-11-06: 120 ug via INTRAVENOUS
  Administered 2018-11-06: 80 ug via INTRAVENOUS

## 2018-11-06 MED ORDER — METHOCARBAMOL 500 MG PO TABS
500.0000 mg | ORAL_TABLET | Freq: Four times a day (QID) | ORAL | Status: DC | PRN
  Administered 2018-11-06 – 2018-11-08 (×5): 500 mg via ORAL
  Filled 2018-11-06 (×5): qty 1

## 2018-11-06 MED ORDER — ONDANSETRON HCL 4 MG/2ML IJ SOLN
INTRAMUSCULAR | Status: DC | PRN
  Administered 2018-11-06: 4 mg via INTRAVENOUS

## 2018-11-06 MED ORDER — OXYCODONE HCL ER 10 MG PO T12A
10.0000 mg | EXTENDED_RELEASE_TABLET | Freq: Two times a day (BID) | ORAL | Status: DC
  Administered 2018-11-06 – 2018-11-07 (×3): 10 mg via ORAL
  Filled 2018-11-06 (×3): qty 1

## 2018-11-06 MED ORDER — ACETAMINOPHEN 650 MG RE SUPP: 650.0000 mg | RECTAL | Status: DC | PRN

## 2018-11-06 MED ORDER — ACETAMINOPHEN 10 MG/ML IV SOLN
INTRAVENOUS | Status: AC
  Filled 2018-11-06: qty 100

## 2018-11-06 MED ORDER — LIDOCAINE 2% (20 MG/ML) 5 ML SYRINGE
INTRAMUSCULAR | Status: AC
  Filled 2018-11-06: qty 5

## 2018-11-06 MED ORDER — HYDROMORPHONE HCL 1 MG/ML IJ SOLN
0.5000 mg | INTRAMUSCULAR | Status: DC | PRN
  Administered 2018-11-06 (×2): 0.5 mg via INTRAVENOUS

## 2018-11-06 MED ORDER — ACETAMINOPHEN 325 MG PO TABS
650.0000 mg | ORAL_TABLET | ORAL | Status: DC | PRN
  Administered 2018-11-06 – 2018-11-08 (×3): 650 mg via ORAL
  Filled 2018-11-06 (×3): qty 2

## 2018-11-06 MED ORDER — PHENYLEPHRINE 40 MCG/ML (10ML) SYRINGE FOR IV PUSH (FOR BLOOD PRESSURE SUPPORT)
PREFILLED_SYRINGE | INTRAVENOUS | Status: AC
  Filled 2018-11-06: qty 10

## 2018-11-06 MED ORDER — SUCCINYLCHOLINE CHLORIDE 20 MG/ML IJ SOLN
INTRAMUSCULAR | Status: DC | PRN
  Administered 2018-11-06: 120 mg via INTRAVENOUS

## 2018-11-06 MED ORDER — ONDANSETRON HCL 4 MG/2ML IJ SOLN: 4.0000 mg | Freq: Four times a day (QID) | INTRAMUSCULAR | Status: DC | PRN

## 2018-11-06 MED ORDER — PANTOPRAZOLE SODIUM 40 MG IV SOLR: 40.0000 mg | Freq: Every day | INTRAVENOUS | Status: DC

## 2018-11-06 MED ORDER — GLYCOPYRROLATE 0.2 MG/ML IJ SOLN
INTRAMUSCULAR | Status: DC | PRN
  Administered 2018-11-06: 0.2 mg via INTRAVENOUS

## 2018-11-06 MED ORDER — SUCCINYLCHOLINE CHLORIDE 200 MG/10ML IV SOSY
PREFILLED_SYRINGE | INTRAVENOUS | Status: AC
  Filled 2018-11-06: qty 10

## 2018-11-06 MED ORDER — HYDROMORPHONE HCL 1 MG/ML IJ SOLN: 1.0000 mg | INTRAMUSCULAR | Status: DC | PRN

## 2018-11-06 MED ORDER — FENTANYL CITRATE (PF) 100 MCG/2ML IJ SOLN
INTRAMUSCULAR | Status: DC | PRN
  Administered 2018-11-06: 100 ug via INTRAVENOUS
  Administered 2018-11-06: 50 ug via INTRAVENOUS
  Administered 2018-11-06: 100 ug via INTRAVENOUS
  Administered 2018-11-06 (×3): 50 ug via INTRAVENOUS

## 2018-11-06 MED ORDER — TAMSULOSIN HCL 0.4 MG PO CAPS
0.4000 mg | ORAL_CAPSULE | Freq: Every day | ORAL | Status: DC
  Administered 2018-11-06 – 2018-11-07 (×2): 0.4 mg via ORAL
  Filled 2018-11-06 (×2): qty 1

## 2018-11-06 MED ORDER — ACETAMINOPHEN 10 MG/ML IV SOLN
INTRAVENOUS | Status: DC | PRN
  Administered 2018-11-06: 1000 mg via INTRAVENOUS

## 2018-11-06 MED ORDER — CEFAZOLIN SODIUM-DEXTROSE 1-4 GM/50ML-% IV SOLN
1.0000 g | Freq: Three times a day (TID) | INTRAVENOUS | Status: AC
  Administered 2018-11-06 (×2): 1 g via INTRAVENOUS
  Filled 2018-11-06 (×3): qty 50

## 2018-11-06 MED ORDER — SURGIFOAM 100 EX MISC
CUTANEOUS | Status: DC | PRN
  Administered 2018-11-06: 1 via TOPICAL

## 2018-11-06 MED ORDER — CEFAZOLIN SODIUM-DEXTROSE 2-4 GM/100ML-% IV SOLN: 2.0000 g | INTRAVENOUS | Status: DC

## 2018-11-06 MED ORDER — FENTANYL CITRATE (PF) 250 MCG/5ML IJ SOLN
INTRAMUSCULAR | Status: AC
  Filled 2018-11-06: qty 5

## 2018-11-06 MED ORDER — ONDANSETRON HCL 4 MG/2ML IJ SOLN
INTRAMUSCULAR | Status: AC
  Filled 2018-11-06: qty 2

## 2018-11-06 MED ORDER — BUPIVACAINE-EPINEPHRINE 0.25% -1:200000 IJ SOLN
INTRAMUSCULAR | Status: DC | PRN
  Administered 2018-11-06: 20 mL

## 2018-11-06 MED ORDER — LACTATED RINGERS IV SOLN: INTRAVENOUS | Status: DC

## 2018-11-06 MED ORDER — HEMOSTATIC AGENTS (NO CHARGE) OPTIME
TOPICAL | Status: DC | PRN
  Administered 2018-11-06: 2 via TOPICAL

## 2018-11-06 MED ORDER — MENTHOL 3 MG MT LOZG: 1.0000 | LOZENGE | OROMUCOSAL | Status: DC | PRN

## 2018-11-06 MED ORDER — METHOCARBAMOL 1000 MG/10ML IJ SOLN
500.0000 mg | Freq: Four times a day (QID) | INTRAVENOUS | Status: DC | PRN
  Administered 2018-11-06: 500 mg via INTRAVENOUS
  Filled 2018-11-06 (×2): qty 5

## 2018-11-06 MED ORDER — SODIUM CHLORIDE 0.9% FLUSH: 3.0000 mL | INTRAVENOUS | Status: DC | PRN

## 2018-11-06 MED ORDER — PROPOFOL 10 MG/ML IV BOLUS
INTRAVENOUS | Status: DC | PRN
  Administered 2018-11-06 (×2): 100 mg via INTRAVENOUS

## 2018-11-06 MED ORDER — FLEET ENEMA 7-19 GM/118ML RE ENEM
1.0000 | ENEMA | Freq: Once | RECTAL | Status: AC
  Administered 2018-11-07: 1 via RECTAL
  Filled 2018-11-06: qty 1

## 2018-11-06 MED ORDER — SODIUM CHLORIDE 0.9 % IV SOLN
INTRAVENOUS | Status: DC | PRN
  Administered 2018-11-06: 40 ug/min via INTRAVENOUS

## 2018-11-06 MED ORDER — CEFAZOLIN SODIUM-DEXTROSE 2-3 GM-%(50ML) IV SOLR
INTRAVENOUS | Status: DC | PRN
  Administered 2018-11-06: 2 g via INTRAVENOUS

## 2018-11-06 MED ORDER — HYDROMORPHONE HCL 1 MG/ML IJ SOLN: 0.2500 mg | INTRAMUSCULAR | Status: DC | PRN

## 2018-11-06 MED ORDER — BUPIVACAINE-EPINEPHRINE 0.25% -1:200000 IJ SOLN
INTRAMUSCULAR | Status: AC
  Filled 2018-11-06: qty 1

## 2018-11-06 MED ORDER — SODIUM CHLORIDE 0.9 % IV SOLN: 250.0000 mL | INTRAVENOUS | Status: DC

## 2018-11-06 MED ORDER — MIDAZOLAM HCL 5 MG/5ML IJ SOLN
INTRAMUSCULAR | Status: DC | PRN
  Administered 2018-11-06: 2 mg via INTRAVENOUS

## 2018-11-06 MED ORDER — PROPOFOL 10 MG/ML IV BOLUS
INTRAVENOUS | Status: AC
  Filled 2018-11-06: qty 20

## 2018-11-06 MED ORDER — MIDAZOLAM HCL 2 MG/2ML IJ SOLN
INTRAMUSCULAR | Status: AC
  Filled 2018-11-06: qty 2

## 2018-11-06 MED ORDER — OXYCODONE HCL 5 MG PO TABS
10.0000 mg | ORAL_TABLET | ORAL | Status: DC | PRN
  Administered 2018-11-06 – 2018-11-08 (×6): 10 mg via ORAL
  Filled 2018-11-06 (×7): qty 2

## 2018-11-06 MED ORDER — LACTATED RINGERS IV SOLN
INTRAVENOUS | Status: DC | PRN
  Administered 2018-11-06 (×2): via INTRAVENOUS

## 2018-11-06 MED ORDER — DEXAMETHASONE SODIUM PHOSPHATE 10 MG/ML IJ SOLN
INTRAMUSCULAR | Status: DC | PRN
  Administered 2018-11-06: 10 mg via INTRAVENOUS

## 2018-11-06 MED ORDER — PROPOFOL 500 MG/50ML IV EMUL
INTRAVENOUS | Status: DC | PRN
  Administered 2018-11-06: 50 ug/kg/min via INTRAVENOUS

## 2018-11-06 MED ORDER — SODIUM CHLORIDE 0.9% FLUSH
3.0000 mL | Freq: Two times a day (BID) | INTRAVENOUS | Status: DC
  Administered 2018-11-06 – 2018-11-07 (×3): 3 mL via INTRAVENOUS

## 2018-11-06 MED ORDER — PHENOL 1.4 % MT LIQD: 1.0000 | OROMUCOSAL | Status: DC | PRN

## 2018-11-06 MED ORDER — MAGNESIUM CITRATE PO SOLN
0.5000 | Freq: Once | ORAL | Status: AC
  Administered 2018-11-06: 0.5 via ORAL
  Filled 2018-11-06: qty 296

## 2018-11-06 MED FILL — Thrombin (Recombinant) For Soln 20000 Unit: CUTANEOUS | Qty: 1 | Status: AC

## 2018-11-06 SURGICAL SUPPLY — 75 items
BLADE CLIPPER SURG (BLADE) IMPLANT
BONE MATRIX VIVIGEN 5CC (Bone Implant) ×2 IMPLANT
BUR EGG ELITE 4.0 (BURR) IMPLANT
CABLE BIPOLOR RESECTION CORD (MISCELLANEOUS) ×2 IMPLANT
CLIP NEUROVISION LG (CLIP) ×2 IMPLANT
CLSR STERI-STRIP ANTIMIC 1/2X4 (GAUZE/BANDAGES/DRESSINGS) ×2 IMPLANT
COVER MAYO STAND STRL (DRAPES) IMPLANT
COVER SURGICAL LIGHT HANDLE (MISCELLANEOUS) ×2 IMPLANT
COVER WAND RF STERILE (DRAPES) ×2 IMPLANT
DRAPE C-ARM 42X72 X-RAY (DRAPES) ×2 IMPLANT
DRAPE C-ARMOR (DRAPES) ×2 IMPLANT
DRAPE POUCH INSTRU U-SHP 10X18 (DRAPES) ×2 IMPLANT
DRAPE SURG 17X23 STRL (DRAPES) ×2 IMPLANT
DRAPE U-SHAPE 47X51 STRL (DRAPES) ×2 IMPLANT
DRSG OPSITE POSTOP 3X4 (GAUZE/BANDAGES/DRESSINGS) ×2 IMPLANT
DRSG OPSITE POSTOP 4X6 (GAUZE/BANDAGES/DRESSINGS) ×2 IMPLANT
DRSG OPSITE POSTOP 4X8 (GAUZE/BANDAGES/DRESSINGS) ×2 IMPLANT
DURAPREP 26ML APPLICATOR (WOUND CARE) ×2 IMPLANT
ELECT BLADE 4.0 EZ CLEAN MEGAD (MISCELLANEOUS)
ELECT BLADE 6.5 EXT (BLADE) ×2 IMPLANT
ELECT CAUTERY BLADE 6.4 (BLADE) ×2 IMPLANT
ELECT PENCIL ROCKER SW 15FT (MISCELLANEOUS) ×2 IMPLANT
ELECT REM PT RETURN 9FT ADLT (ELECTROSURGICAL) ×2
ELECTRODE BLDE 4.0 EZ CLN MEGD (MISCELLANEOUS) IMPLANT
ELECTRODE REM PT RTRN 9FT ADLT (ELECTROSURGICAL) ×1 IMPLANT
GLOVE BIOGEL PI IND STRL 8.5 (GLOVE) ×1 IMPLANT
GLOVE BIOGEL PI INDICATOR 8.5 (GLOVE) ×1
GLOVE SS BIOGEL STRL SZ 8.5 (GLOVE) ×1 IMPLANT
GLOVE SUPERSENSE BIOGEL SZ 8.5 (GLOVE) ×1
GOWN STRL REUS W/ TWL LRG LVL3 (GOWN DISPOSABLE) ×1 IMPLANT
GOWN STRL REUS W/TWL 2XL LVL3 (GOWN DISPOSABLE) ×4 IMPLANT
GOWN STRL REUS W/TWL LRG LVL3 (GOWN DISPOSABLE) ×1
GUIDEWIRE NITINOL BEVEL TIP (WIRE) ×12 IMPLANT
KIT BASIN OR (CUSTOM PROCEDURE TRAY) ×2 IMPLANT
KIT POSITION SURG JACKSON T1 (MISCELLANEOUS) ×2 IMPLANT
KIT TURNOVER KIT B (KITS) ×2 IMPLANT
MIX DBX 10CC 35% BONE (Bone Implant) ×2 IMPLANT
MODULE EMG NEEDLE SSEP NVM5 (NEEDLE) ×2 IMPLANT
MODULE NVM5 NEXT GEN EMG (NEEDLE) ×2 IMPLANT
NEEDLE 22X1 1/2 (OR ONLY) (NEEDLE) ×2 IMPLANT
NEEDLE I-PASS III (NEEDLE) ×2 IMPLANT
NEEDLE SPNL 18GX3.5 QUINCKE PK (NEEDLE) ×2 IMPLANT
NS IRRIG 1000ML POUR BTL (IV SOLUTION) ×2 IMPLANT
PACK LAMINECTOMY ORTHO (CUSTOM PROCEDURE TRAY) ×2 IMPLANT
PACK UNIVERSAL I (CUSTOM PROCEDURE TRAY) ×2 IMPLANT
PAD ARMBOARD 7.5X6 YLW CONV (MISCELLANEOUS) ×4 IMPLANT
PATTIES SURGICAL .5 X.5 (GAUZE/BANDAGES/DRESSINGS) IMPLANT
PATTIES SURGICAL .5 X1 (DISPOSABLE) ×2 IMPLANT
POSITIONER HEAD PRONE TRACH (MISCELLANEOUS) ×2 IMPLANT
PROBE BALL TIP NVM5 SNG USE (BALLOONS) ×2 IMPLANT
REDUCTION EXT RELINE MAS MOD (Neuro Prosthesis/Implant) ×12 IMPLANT
ROD RELINE MAS LORDOTIC 5.5X60 (Rod) ×2 IMPLANT
ROD RELINE TI MAS LD 5.5X65 (Rod) ×2 IMPLANT
SCREW LOCK RELINE 5.5 TULIP (Screw) ×12 IMPLANT
SCREW SHANK RELINE 6.5X45MM 2C (Screw) ×8 IMPLANT
SCREW SHANK RELINE 7.5X40MM 2C (Screw) ×2 IMPLANT
SPONGE LAP 4X18 RFD (DISPOSABLE) ×4 IMPLANT
SPONGE SURGIFOAM ABS GEL 100 (HEMOSTASIS) ×2 IMPLANT
SURGIFLO W/THROMBIN 8M KIT (HEMOSTASIS) ×4 IMPLANT
SUT BONE WAX W31G (SUTURE) ×2 IMPLANT
SUT MNCRL AB 3-0 PS2 18 (SUTURE) ×4 IMPLANT
SUT VIC AB 0 CT1 27 (SUTURE) ×1
SUT VIC AB 0 CT1 27XBRD ANBCTR (SUTURE) ×1 IMPLANT
SUT VIC AB 1 CT1 18XCR BRD 8 (SUTURE) ×1 IMPLANT
SUT VIC AB 1 CT1 8-18 (SUTURE) ×1
SUT VIC AB 1 CTX 36 (SUTURE)
SUT VIC AB 1 CTX36XBRD ANBCTR (SUTURE) IMPLANT
SUT VIC AB 2-0 CT1 18 (SUTURE) ×2 IMPLANT
SYR BULB IRRIGATION 50ML (SYRINGE) ×2 IMPLANT
SYR CONTROL 10ML LL (SYRINGE) ×4 IMPLANT
TOWEL GREEN STERILE (TOWEL DISPOSABLE) ×2 IMPLANT
TOWEL GREEN STERILE FF (TOWEL DISPOSABLE) ×2 IMPLANT
TRAY FOLEY MTR SLVR 16FR STAT (SET/KITS/TRAYS/PACK) ×2 IMPLANT
WATER STERILE IRR 1000ML POUR (IV SOLUTION) ×2 IMPLANT
YANKAUER SUCT BULB TIP NO VENT (SUCTIONS) ×2 IMPLANT

## 2018-11-06 NOTE — Op Note (Signed)
Operative report  Preoperative diagnosis: Discogenic back pain L4-5 with L5 radicular leg pain.  Status post L4-5 discectomy (postlaminectomy syndrome)  Postoperative diagnosis: Same  Operative procedure: Posterior supplemental pedicle screw instrumentation L4-S1.  Posterior lateral arthrodesis L4-S1  Complications: None  Neuro monitoring: No adverse free running EMG or SSEP activity.  All pedicle screws tested with no activity at greater than 40 mA except for the right L5 which was positive at 21 mA.  Implants:Nuvasive MIS pedicle screws: L4 and L5: 6.5 x 45.  S1: 7.5 x 40.  Allograft: DBX mix and vivogen  Indications: Prem is a very pleasant 52 year old gentleman who underwent a lateral interbody fusion at L4-5 yesterday and returns today for posterior supplemental fixation.  Preoperatively the radicular leg pain have resolved but unfortunately he continues to have significant right anterior thigh pain and groin pain.  This was to be expected based on the trans-psoas approach that was done through the right side.  At this point since he was not having the same preoperative radicular leg pain I elected not to move forward with the open decompression rather the pedicle screw fixation and posterior lateral grafting.  This was explained to the patient and he expressed understanding of the assessment and plan.  Consent was obtained after reviewing all risks benefits and alternatives.  Operative report:  Patient was brought into the operating room placed by the operating room table.  After successful induction of general anesthesia and endotracheal intubation the patient was turned prone onto the [facility] spine frame.  The Foley and SCDs were properly positioned and all bony prominences well-padded.  The neuro monitoring then applied all appropriate pads and needles to monitor SSEP and the back was then prepped and draped in a standard fashion.  Timeout was taken to confirm patient procedure and all  other important data.  Starting on the right-hand side I identified the lateral border of the L4 pedicle and marked the skin surface.  I repeated this for L5 and S1.  I infiltrated the area with quarter percent Marcaine with epinephrine.  A longitudinal incision was made only 2 fingerbreadths to the right of midline and I sharply dissected down to the deep deep fascia was sharply incised and I bluntly dissected through the paraspinal musculature  At this point I placed the needl onto the lateral aspect of the set joint and then gently advanced into the pedicle.  I utilized the fluoroscopy as well as direct neural stimulation during the insertion of the trocar.  In the AP plane as I near the medial wall of the pedicle I switched to the lateral view to confirm that I was just beyond the posterior wall the vertebral body.  Once confirmed I advanced into the vertebral body.  I then placed a guidepin to cannulate the pedicle.  The trocar was removed.  I repeated this exact same procedure at L5-S1.  Once all of the right 3 pedicles were cannulated I went to the contralateral side and using the same technique cannulated the L4-L5 and S1 pedicles.  With all 6 pedicles cannulated I proceeded to place my pedicle screws.  I placed the pedicle screw retractor blade at L4 and S1.  I then set up my retractors so I could see the facet complex.  I then decorticated the facet complex with a high-speed bur anddecorticated the transverse process of L5 and a portion of the sacral ala of S1.  Placed the L5 pedicle screw.  I then placed the bone graft  in the posterior lateral corner at L5-S1.  I then went to the L4 transverse process high-speed bur decorticated and placed my posterior lateral bone graft.  With the posterior lateral arthrodesis complete I then remove the retracting devices and applied the polyaxial heads.  I then measured and then placed the appropriate size rod and secured it with a 3 locking top nuts.  All 3 were  then torqued off according manufacturer standards.  The wound was then irrigated copiously normal saline and I made sure that hemostasis using bipolar cautery and FloSeal.  I then went to the right-hand side and is the L4 in the S1 pedicle screws.  Attached to the screws with a retracting blades to visualize the posterior lateral gutter I again used the same technique that I used on the contralateral side.  The facet complex was identified at L4-5 and L5-S1 and decorticated using a high-speed bur.  Transverse processes of L4 and L5 were also palpated and decorticated with a high-speed bur.  Bone graft was then placed in the posterior lateral gutter.  Polyaxial heads were attached the rod was measured and secured into place.  Again all 3 top locking nuts were inserted and then tightened and torqued off according manufacturer standards.  At this point I irrigated the wound copiously normal saline and obtained hemostasis using bipolar electrocautery FloSeal.  Both wounds were closed in a layered fashion with interrupted #1 Vicryl suture, 2-0 Vicryl suture demonstrated satisfactory position of the pedicle screws in the rod.  There was no change to the anterior L5-S1 disc replacement or the L4-5 intervertebral cage.  After completing the final x-rays and determine if they were satisfactory I closed both wounds in a layered fashion with interrupted #1 Vicryl suture, 2-0 Vicryl suture, Steri-Strips and dry dressings were applied and the patient was ultimately extubated transfer the PACU without incident.  The end of the case all needle sponge counts were correct.  There are no adverse intraoperative

## 2018-11-06 NOTE — Anesthesia Procedure Notes (Signed)
Procedure Name: Intubation Date/Time: 11/06/2018 7:54 AM Performed by: Twanda Stakes T, CRNA Pre-anesthesia Checklist: Patient identified, Emergency Drugs available, Suction available and Patient being monitored Patient Re-evaluated:Patient Re-evaluated prior to induction Oxygen Delivery Method: Circle system utilized Preoxygenation: Pre-oxygenation with 100% oxygen Induction Type: IV induction Ventilation: Mask ventilation without difficulty and Oral airway inserted - appropriate to patient size Laryngoscope Size: Miller and 3 Grade View: Grade I Tube type: Oral Tube size: 7.5 mm Number of attempts: 1 Airway Equipment and Method: Patient positioned with wedge pillow and Stylet Placement Confirmation: ETT inserted through vocal cords under direct vision,  positive ETCO2 and breath sounds checked- equal and bilateral Secured at: 23 cm Tube secured with: Tape Dental Injury: Teeth and Oropharynx as per pre-operative assessment

## 2018-11-06 NOTE — Evaluation (Signed)
Physical Therapy Evaluation Patient Details Name: Cory Holland MRN: [id number] DOB: Feb 10, 1966 Today's Date: 11/06/2018   History of Present Illness  Pt is a 52 y/o male who is s/p L4-5 lateral interbody fusion on 11/21, and s/p L4-S1 posterior lateral arthrodesis. PMH includes sleep apnea on CPAP, HTN, and DM.   Clinical Impression  Patient is s/p above surgery resulting in the deficits listed below (see PT Problem List). Pt with RLE numbness and weakness. Pt with 2 instances of knee buckling requiring min A with use of RW. Educated about use of RW at home to increase safety. Educated about back precautions and generalized walking program. Feel pt will progress well and if stability improves, may be able to progress home without follow up PT services. Patient will benefit from skilled PT to increase their independence and safety with mobility (while adhering to their precautions) to allow discharge to the venue listed below.     Follow Up Recommendations Home health PT;Supervision for mobility/OOB(May progress to no PT follow up if stability improves)    Equipment Recommendations  Rolling walker with 5" wheels    Recommendations for Other Services       Precautions / Restrictions Precautions Precautions: Back Precaution Booklet Issued: Yes (comment) Precaution Comments: REviewed back precautions with pt.  Restrictions Weight Bearing Restrictions: No      Mobility  Bed Mobility Overal bed mobility: Needs Assistance Bed Mobility: Rolling;Sidelying to Sit Rolling: Supervision Sidelying to sit: Supervision       General bed mobility comments: Supervision and increased time required to come to sitting at EOB. Increased time and effort required secondary to pain.   Transfers Overall transfer level: Needs assistance Equipment used: Rolling walker (2 wheeled) Transfers: Sit to/from Stand Sit to Stand: Min assist;From elevated surface         General transfer comment: Min  A for lift assist and steadying from elevated surface. Cues for safe hand placement.   Ambulation/Gait Ambulation/Gait assistance: Min assist;Min guard Gait Distance (Feet): 125 Feet Assistive device: Rolling walker (2 wheeled) Gait Pattern/deviations: Step-through pattern;Decreased stride length Gait velocity: Decreased    General Gait Details: Slow, cautious gait. Pt with 2 instances of R knee buckling, requiring min A for steadying assist. Cues to press through UEs for increased stability. Pt also with increased shakiness by end of gait.   Stairs            Wheelchair Mobility    Modified Rankin (Stroke Patients Only)       Balance Overall balance assessment: Needs assistance Sitting-balance support: No upper extremity supported;Feet supported Sitting balance-Leahy Scale: Good     Standing balance support: Bilateral upper extremity supported;During functional activity Standing balance-Leahy Scale: Poor Standing balance comment: Reliant on BUE support                              Pertinent Vitals/Pain Pain Assessment: Faces Faces Pain Scale: Hurts even more Pain Location: back Pain Descriptors / Indicators: Aching;Operative site guarding Pain Intervention(s): Limited activity within patient's tolerance;Monitored during session;Repositioned    Home Living Family/patient expects to be discharged to:: Private residence Living Arrangements: Spouse/significant other;Children Available Help at Discharge: Family;Available 24 hours/day Type of Home: House Home Access: Stairs to enter Entrance Stairs-Rails: Left Entrance Stairs-Number of Steps: 4 Home Layout: One level Home Equipment: Cane - single point;Shower seat;Bedside commode      Prior Function Level of Independence: Independent  Hand Dominance        Extremity/Trunk Assessment   Upper Extremity Assessment Upper Extremity Assessment: Defer to OT evaluation    Lower  Extremity Assessment Lower Extremity Assessment: RLE deficits/detail RLE Deficits / Details: R knee buckling noted. Pt reports numbness has gotten better from yesterday, however, RLE still felt a bit weak.     Cervical / Trunk Assessment Cervical / Trunk Assessment: Other exceptions Cervical / Trunk Exceptions: s/p lumbar surgery  Communication   Communication: No difficulties  Cognition Arousal/Alertness: Awake/alert Behavior During Therapy: WFL for tasks assessed/performed Overall Cognitive Status: Within Functional Limits for tasks assessed                                        General Comments General comments (skin integrity, edema, etc.): Educated about generalized walking program.     Exercises     Assessment/Plan    PT Assessment Patient needs continued PT services  PT Problem List Decreased strength;Decreased activity tolerance;Decreased balance;Decreased mobility;Decreased knowledge of use of DME;Decreased knowledge of precautions;Pain       PT Treatment Interventions      PT Goals (Current goals can be found in the Care Plan section)  Acute Rehab PT Goals Patient Stated Goal: to go home PT Goal Formulation: With patient Time For Goal Achievement: 11/20/18 Potential to Achieve Goals: Good    Frequency Min 5X/week   Barriers to discharge        Co-evaluation               AM-PAC PT "6 Clicks" Daily Activity  Outcome Measure Difficulty turning over in bed (including adjusting bedclothes, sheets and blankets)?: A Lot Difficulty moving from lying on back to sitting on the side of the bed? : A Lot Difficulty sitting down on and standing up from a chair with arms (e.g., wheelchair, bedside commode, etc,.)?: Unable Help needed moving to and from a bed to chair (including a wheelchair)?: A Little Help needed walking in hospital room?: A Little Help needed climbing 3-5 steps with a railing? : A Lot 6 Click Score: 13    End of Session  Equipment Utilized During Treatment: Gait belt;Back brace Activity Tolerance: Patient tolerated treatment well Patient left: in bed;with call bell/phone within reach;with family/visitor present(sitting EOB ) Nurse Communication: Mobility status PT Visit Diagnosis: Unsteadiness on feet (R26.81);Muscle weakness (generalized) (M62.81);Pain Pain - part of body: (back)    Time: [id number] PT Time Calculation (min) (ACUTE ONLY): 24 min   Charges:   PT Evaluation $PT Eval Low Complexity: 1 Low PT Treatments $Gait Training: 8-22 mins        Gladys DammeBrittany Fausto Sampedro, PT, DPT  Acute Rehabilitation Services  Pager: [phone] Office: [phone]   Lehman PromBrittany S Aleiyah Halpin 11/06/2018, 5:10 PM

## 2018-11-06 NOTE — Progress Notes (Signed)
OT Cancellation Note  Patient Details Name: Cory Holland MRN: [id number] DOB: 1966-12-13   Cancelled Treatment:    Reason Eval/Treat Not Completed: Patient at procedure or test/ unavailable.  Pt in OR.  Jeani HawkingWendi Zineb Glade, OTR/L Acute Rehabilitation Services Pager [phone] Office [phone]   Jeani HawkingConarpe, [name] 11/06/2018, 8:29 AM

## 2018-11-06 NOTE — Transfer of Care (Signed)
Immediate Anesthesia Transfer of Care Note  Patient: Cory Holland  Procedure(s) Performed: POSTERIOR LUMBAR FUSION Lumbar 4-Sacral 1,   (N/A Back)  Patient Location: PACU  Anesthesia Type:General  Level of Consciousness: oriented and drowsy  Airway & Oxygen Therapy: Patient Spontanous Breathing and Patient connected to nasal cannula oxygen  Post-op Assessment: Report given to RN, Post -op Vital signs reviewed and stable and Patient moving all extremities  Post vital signs: Reviewed and stable  Last Vitals:  Vitals Value Taken Time  BP 121/49 11/06/2018 11:18 AM  Temp    Pulse 101 11/06/2018 11:22 AM  Resp 10 11/06/2018 11:22 AM  SpO2 96 % 11/06/2018 11:22 AM  Vitals shown include unvalidated device data.  Last Pain:  Vitals:   11/06/18 0502  TempSrc:   PainSc: 4       Patients Stated Pain Goal: 3 (11/06/18 0402)  Complications: No apparent anesthesia complications

## 2018-11-06 NOTE — Anesthesia Postprocedure Evaluation (Signed)
Anesthesia Post Note  Patient: Willette ClusterDennis E Farewell  Procedure(s) Performed: POSTERIOR LUMBAR FUSION Lumbar 4-Sacral 1,   (N/A Back)     Patient location during evaluation: PACU Anesthesia Type: General Level of consciousness: awake and alert Pain management: pain level controlled Vital Signs Assessment: post-procedure vital signs reviewed and stable Respiratory status: spontaneous breathing, nonlabored ventilation, respiratory function stable and patient connected to nasal cannula oxygen Cardiovascular status: blood pressure returned to baseline and stable Postop Assessment: no apparent nausea or vomiting Anesthetic complications: no    Last Vitals:  Vitals:   11/06/18 1202 11/06/18 1217  BP: 118/65 123/66  Pulse: (!) 101 (!) 101  Resp: (!) 22 (!) 27  Temp:    SpO2: 98% 100%    Last Pain:  Vitals:   11/06/18 1219  TempSrc:   PainSc: 7                  Javayah Magaw,W. EDMOND

## 2018-11-06 NOTE — Brief Op Note (Signed)
11/05/2018 - 11/06/2018  11:02 AM  PATIENT:  Willette Clusterennis E Westerhoff  52 y.o. 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  POST-OPERATIVE DIAGNOSIS:  posterior laminectomy syndrome with degenerative disc disease lumbar four - Sacral 1   PROCEDURE:  Procedure(s) with comments: POSTERIOR LUMBAR FUSION Lumbar 4-Sacral 1,   (N/A) - 4 hrs  SURGEON:  Surgeon(s) and Role:    Venita Lick* Emeline Simpson, MD - Primary  PHYSICIAN ASSISTANT:   ASSISTANTS: none   ANESTHESIA:   general  EBL:  300 mL   BLOOD ADMINISTERED:none  DRAINS: none   LOCAL MEDICATIONS USED:  MARCAINE     SPECIMEN:  No Specimen  DISPOSITION OF SPECIMEN:  N/A  COUNTS:  YES  TOURNIQUET:  * No tourniquets in log *  DICTATION: .Dragon Dictation  PLAN OF CARE: Admit to inpatient   PATIENT DISPOSITION:  PACU - hemodynamically stable.

## 2018-11-06 NOTE — Progress Notes (Signed)
    Subjective: Procedure(s) (LRB): POSTERIOR LUMBAR FUSION L4-S1, possible revision posterior decompression L4-5 (N/A) Day of Surgery  Patient reports pain as 5 on 0-10 scale.  - complains of right groin/anterior thigh pain Reports increased leg pain - groin and anterior thigh reports incisional back pain   N/A void - foley remains inplace Negative bowel movement Positive flatus Negative chest pain or shortness of breath  Objective: Vital signs in last 24 hours: Temp:  [97.2 F (36.2 C)-98.4 F (36.9 C)] 98.4 F (36.9 C) (11/20 2314) Pulse Rate:  [80-114] 80 (11/21 0430) Resp:  [5-20] 20 (11/21 0430) BP: (97-131)/(66-84) 119/75 (11/21 0430) SpO2:  [90 %-100 %] 97 % (11/21 0430) Weight:  [108.1 kg] 108.1 kg (11/20 1231)  Intake/Output from previous day: 11/20 0701 - 11/21 0700 In: 1080 [I.V.:1080] Out: 2550 [Urine:2500; Blood:50]  Labs: No results for input(s): WBC, RBC, HCT, PLT in the last 72 hours. No results for input(s): NA, K, CL, CO2, BUN, CREATININE, GLUCOSE, CALCIUM in the last 72 hours. No results for input(s): LABPT, INR in the last 72 hours.  Physical Exam: ABD soft Intact pulses distally Dorsiflexion/Plantar flexion intact Incision: dressing C/D/I Compartment soft pain with active/passive hip flexor movement on the right  Sensation over the right anterior thigh is decreased Negative st leg raise test 5/5 motor EHL/TA/GA. Body mass index is 36.23 kg/m.   Assessment/Plan: Patient stable  xrays n/a Continue mobilization with physical therapy Continue care  1. Patient with significant right groin pain - to be expected given the trans-psoas approach 2. During the case there was no evidence of lumbar plexus irritation or injury - will continue to monitor exam 3. No evidence of L4 or L5 radiculopathy - will move forward with supplemental posterior fixation.  No indication for direct posterior decompression given the absence of his pre-op radicular leg  pain. 4. Discussed with patient and he is in agreement with assessment and plan  Venita Lickahari Dimitri Shakespeare, MD [facility] [phone]

## 2018-11-07 LAB — GLUCOSE, CAPILLARY
GLUCOSE-CAPILLARY: 171 mg/dL — AB (ref 70–99)
GLUCOSE-CAPILLARY: 143 mg/dL — AB (ref 70–99)
GLUCOSE-CAPILLARY: 140 mg/dL — AB (ref 70–99)
Glucose-Capillary: 199 mg/dL — ABNORMAL HIGH (ref 70–99)

## 2018-11-07 MED ORDER — METHOCARBAMOL 500 MG PO TABS: 500.0000 mg | ORAL_TABLET | Freq: Four times a day (QID) | ORAL | 1 refills | Status: AC | PRN

## 2018-11-07 MED ORDER — OXYCODONE HCL 5 MG PO TABS: 5.0000 mg | ORAL_TABLET | ORAL | 0 refills | Status: AC | PRN

## 2018-11-07 MED ORDER — DOCUSATE SODIUM 100 MG PO CAPS: 100.0000 mg | ORAL_CAPSULE | Freq: Two times a day (BID) | ORAL | 0 refills | Status: AC

## 2018-11-07 MED ORDER — POLYETHYLENE GLYCOL 3350 17 G PO PACK: 17.0000 g | PACK | Freq: Every day | ORAL | 0 refills | Status: AC | PRN

## 2018-11-07 MED ORDER — GABAPENTIN 400 MG PO CAPS: 400.0000 mg | ORAL_CAPSULE | Freq: Three times a day (TID) | ORAL | 1 refills | Status: AC | PRN

## 2018-11-07 MED ORDER — OXYCODONE HCL ER 10 MG PO T12A: 10.0000 mg | EXTENDED_RELEASE_TABLET | Freq: Two times a day (BID) | ORAL | 0 refills | Status: AC

## 2018-11-07 NOTE — Discharge Instructions (Signed)
Walk As Tolerated utilizing back precautions.  No bending, twisting, or lifting.  No driving.    See Dr. Shon BatonBrooks in office in 10 to 14 days. Begin taking aspirin 81mg  per day starting 7 days after your surgery (Thursday 11/28) if not allergic to aspirin or on another blood thinner. Walk daily even outside. Use a cane or walker only if necessary. Avoid sitting on soft sofas.

## 2018-11-07 NOTE — Evaluation (Signed)
Occupational Therapy Evaluation Patient Details Name: Cory Holland MRN: [id number] DOB: [birth year] Today's Date: 11/07/2018    History of Present Illness Pt is a 52 y/o male who is s/p L4-5 lateral interbody fusion on 11/21, and s/p L4-S1 posterior lateral arthrodesis. PMH includes sleep apnea on CPAP, HTN, and DM.    Clinical Impression   This 52 y/o male presents with the above. At baseline pt is independent with ADLs and functional mobility. Pt currently requiring minA for functional mobility using RW; minguardA for UB ADL and minA for LB ADL with use of AE due to increased difficulty accessing LEs at this time. Educated pt on brace management, safety, AE and compensatory strategies for performing ADLs and mobility while maintaining precautions with pt verbalizing and return demonstrating understanding during session. Pt will return home with family and reports daughter will present at home to assist with ADLs PRN. Pt will benefit from continued OT services while in acute setting to maximize his overall safety and independence with ADLs and mobility prior to discharge home. Will follow.     Follow Up Recommendations  No OT follow up;Supervision/Assistance - 24 hour    Equipment Recommendations  None recommended by OT(pt's DME needs are met )           Precautions / Restrictions Precautions Precautions: Back Precaution Booklet Issued: Yes (comment) Precaution Comments: Reviewed back precautions with pt.  Required Braces or Orthoses: Spinal Brace Spinal Brace: Lumbar corset;Applied in sitting position Restrictions Weight Bearing Restrictions: No      Mobility Bed Mobility               General bed mobility comments: Pt received sitting EOB having completed tx session with PT  Transfers Overall transfer level: Needs assistance Equipment used: Rolling walker (2 wheeled) Transfers: Sit to/from Stand Sit to Stand: From elevated surface;Min assist         General  transfer comment: light Min A for lift assist and steadying from elevated surface. Pt demonstrates safe hand placement. Pt slow and guarded with movements     Balance Overall balance assessment: Needs assistance Sitting-balance support: No upper extremity supported;Feet supported Sitting balance-Leahy Scale: Good     Standing balance support: Bilateral upper extremity supported;During functional activity Standing balance-Leahy Scale: Poor Standing balance comment: Reliant on UE support                           ADL either performed or assessed with clinical judgement   ADL Overall ADL's : Needs assistance/impaired Eating/Feeding: Modified independent;Sitting Eating/Feeding Details (indicate cue type and reason): setup for breakfast end of session Grooming: Set up;Sitting;Minimal assistance;Standing Grooming Details (indicate cue type and reason): minA standing balance Upper Body Bathing: Min guard;Sitting   Lower Body Bathing: Minimal assistance;Sit to/from stand;With adaptive equipment Lower Body Bathing Details (indicate cue type and reason): pt requires use of AE to access LEs Upper Body Dressing : Minimal assistance;Sitting Upper Body Dressing Details (indicate cue type and reason): light minA and cues for brace management Lower Body Dressing: Minimal assistance;Sit to/from stand;With adaptive equipment Lower Body Dressing Details (indicate cue type and reason): pt requires use of AE to access LEs at this time; educated on use of sock aide and reacher for LB dressing with pt return demonstrating; will benefit from continued review/practice; pt with difficult lifting RLE during simulation, educated to don RLE first during task Toilet Transfer: Minimal assistance;Ambulation;RW;BSC Toilet Transfer Details (indicate cue type and reason):  BSC over toilet; simulated in transfer to/from [place] and Hygiene: Minimal assistance;Sit to/from stand;With  adaptive equipment Toileting - Clothing Manipulation Details (indicate cue type and reason): pt reports difficulty reaching to perform peri-care after BM; educated on use of AE for completion of task and demonstrated/simulated in room    Tub/Shower Transfer Details (indicate cue type and reason): educated on use of shower seat in tub for increased safety  Functional mobility during ADLs: Minimal assistance;Rolling walker General ADL Comments: pt slow/guarded with movements at this time; educated on brace management, safety, AE and compensatory strategies for performing ADLs and mobility while maintaining precautions     Vision         Perception     Praxis      Pertinent Vitals/Pain Pain Assessment: Faces Faces Pain Scale: Hurts even more Pain Location: back Pain Descriptors / Indicators: Aching;Operative site guarding Pain Intervention(s): Limited activity within patient's tolerance;Monitored during session;Repositioned     Hand Dominance     Extremity/Trunk Assessment Upper Extremity Assessment Upper Extremity Assessment: Overall WFL for tasks assessed   Lower Extremity Assessment Lower Extremity Assessment: Defer to PT evaluation   Cervical / Trunk Assessment Cervical / Trunk Assessment: Other exceptions Cervical / Trunk Exceptions: s/p lumbar surgery   Communication Communication Communication: No difficulties   Cognition Arousal/Alertness: Awake/alert Behavior During Therapy: WFL for tasks assessed/performed Overall Cognitive Status: Within Functional Limits for tasks assessed                                     General Comments       Exercises     Shoulder Instructions      Home Living Family/patient expects to be discharged to:: Private residence Living Arrangements: Spouse/significant other;Children Available Help at Discharge: Family;Available 24 hours/day Type of Home: House Home Access: Stairs to enter [employer] of  Steps: 4 Entrance Stairs-Rails: Left Home Layout: One level     Bathroom Shower/Tub: Teacher, early years/pre: Standard     Home Equipment: Cane - single point;Shower seat;Bedside commode          Prior Functioning/Environment Level of Independence: Independent                 OT Problem List: Decreased strength;Decreased range of motion;Decreased activity tolerance;Impaired balance (sitting and/or standing);Decreased knowledge of use of DME or AE;Decreased knowledge of precautions;Pain      OT Treatment/Interventions: Self-care/ADL training;Therapeutic exercise;DME and/or AE instruction;Therapeutic activities;Patient/family education;Balance training    OT Goals(Current goals can be found in the care plan section) Acute Rehab OT Goals Patient Stated Goal: to go home OT Goal Formulation: With patient Time For Goal Achievement: 11/21/18 Potential to Achieve Goals: Good  OT Frequency: Min 2X/week   Barriers to D/C:            Co-evaluation              AM-PAC PT "6 Clicks" Daily Activity     Outcome Measure Help from another person eating meals?: None Help from another person taking care of personal grooming?: None Help from another person toileting, which includes using toliet, bedpan, or urinal?: A Little Help from another person bathing (including washing, rinsing, drying)?: A Little Help from another person to put on and taking off regular upper body clothing?: A Little Help from another person to put on and taking off regular lower body clothing?: A Little  6 Click Score: 20   End of Session Equipment Utilized During Treatment: Rolling walker;Back brace Nurse Communication: Mobility status  Activity Tolerance: Patient tolerated treatment well Patient left: with call bell/phone within reach;with family/visitor present(sitting EOB to eat breakfast)  OT Visit Diagnosis: Unsteadiness on feet (R26.81);Other abnormalities of gait and mobility  (R26.89);Pain Pain - part of body: (back)                Time: 2929-0903 OT Time Calculation (min): 28 min Charges:  OT General Charges $OT Visit: 1 Visit OT Evaluation $OT Eval Low Complexity: 1 Low OT Treatments $Self Care/Home Management : 8-22 mins  Lou Cal, OT Supplemental Rehabilitation Services Pager [phone] Office [phone]   Raymondo Band 11/07/2018, 10:06 AM

## 2018-11-07 NOTE — Progress Notes (Signed)
Physical Therapy Treatment Patient Details Name: Cory Holland MRN: [id number] DOB: August 02, 1966 Today's Date: 11/07/2018    History of Present Illness Pt is a 52 y/o male who is s/p L4-5 lateral interbody fusion on 11/21, and s/p L4-S1 posterior lateral arthrodesis. PMH includes sleep apnea on CPAP, HTN, and DM.     PT Comments    Pt progressing well with post-op mobility. Pt reports feeling improved today however continues to demonstrate occasional mild R knee buckle with ambulation. Pt was educated on brace application/wearing schedule, car transfer, activity progression and general safety with back precautions. Do not feel pt requires follow-up PT however outpatient PT may be beneficial when MD recommends per post-op protocol. Will continue to follow.   Follow Up Recommendations  No PT follow-up;Supervision for mobility/OOB     Equipment Recommendations  Rolling walker with 5" wheels    Recommendations for Other Services       Precautions / Restrictions Precautions Precautions: Back Precaution Booklet Issued: Yes (comment) Precaution Comments: Reviewed back precautions with pt.  Required Braces or Orthoses: Spinal Brace Spinal Brace: Lumbar corset;Applied in sitting position Restrictions Weight Bearing Restrictions: No    Mobility  Bed Mobility Overal bed mobility: Needs Assistance Bed Mobility: Rolling;Sidelying to Sit Rolling: Min guard Sidelying to sit: Supervision       General bed mobility comments: Hands-on guarding/tactile cues to maintain precautions during logroll.   Transfers Overall transfer level: Needs assistance Equipment used: Rolling walker (2 wheeled) Transfers: Sit to/from Stand Sit to Stand: From elevated surface;Min assist         General transfer comment: light Min A for lift assist and steadying from elevated surface. Pt demonstrates safe hand placement. Pt slow and guarded with movements   Ambulation/Gait Ambulation/Gait assistance:  Min assist;Min guard   Assistive device: Rolling walker (2 wheeled) Gait Pattern/deviations: Step-through pattern;Decreased stride length Gait velocity: Decreased    General Gait Details: Slow, cautious gait. Pt with 2 instances of R knee buckling, requiring min A for steadying assist. Cues to press through UEs for increased stability. Pt also with increased shakiness by end of gait.    Stairs             Wheelchair Mobility    Modified Rankin (Stroke Patients Only)       Balance Overall balance assessment: Needs assistance Sitting-balance support: No upper extremity supported;Feet supported Sitting balance-Leahy Scale: Good     Standing balance support: Bilateral upper extremity supported;During functional activity Standing balance-Leahy Scale: Poor Standing balance comment: Reliant on UE support                            Cognition Arousal/Alertness: Awake/alert Behavior During Therapy: WFL for tasks assessed/performed Overall Cognitive Status: Within Functional Limits for tasks assessed                                        Exercises      General Comments        Pertinent Vitals/Pain Pain Assessment: Faces Faces Pain Scale: Hurts even more Pain Location: back Pain Descriptors / Indicators: Aching;Operative site guarding Pain Intervention(s): Monitored during session;Repositioned    Home Living Family/patient expects to be discharged to:: Private residence Living Arrangements: Spouse/significant other;Children Available Help at Discharge: Family;Available 24 hours/day Type of Home: House Home Access: Stairs to enter Entrance Stairs-Rails: Left Home Layout:  One level Home Equipment: Cane - single point;Shower seat;Bedside commode      Prior Function Level of Independence: Independent          PT Goals (current goals can now be found in the care plan section) Acute Rehab PT Goals Patient Stated Goal: to go home PT  Goal Formulation: With patient Time For Goal Achievement: 11/20/18 Potential to Achieve Goals: Good Progress towards PT goals: Progressing toward goals    Frequency    Min 5X/week      PT Plan      Co-evaluation              AM-PAC PT "6 Clicks" Daily Activity  Outcome Measure  Difficulty turning over in bed (including adjusting bedclothes, sheets and blankets)?: A Lot Difficulty moving from lying on back to sitting on the side of the bed? : A Lot Difficulty sitting down on and standing up from a chair with arms (e.g., wheelchair, bedside commode, etc,.)?: Unable Help needed moving to and from a bed to chair (including a wheelchair)?: A Little Help needed walking in hospital room?: A Little Help needed climbing 3-5 steps with a railing? : A Lot 6 Click Score: 13    End of Session Equipment Utilized During Treatment: Gait belt;Back brace Activity Tolerance: Patient tolerated treatment well Patient left: in bed;with call bell/phone within reach;with family/visitor present(sitting EOB ) Nurse Communication: Mobility status PT Visit Diagnosis: Unsteadiness on feet (R26.81);Muscle weakness (generalized) (M62.81);Pain Pain - part of body: (back)     Time: [id number] PT Time Calculation (min) (ACUTE ONLY): 29 min  Charges:  $Gait Training: 23-37 mins                     Conni SlipperLaura Kingslee Mairena, PT, DPT Acute Rehabilitation Services Pager: [phone] Office: [phone]    Marylynn PearsonLaura D Brooklyn Jeff 11/07/2018, 12:10 PM

## 2018-11-07 NOTE — Progress Notes (Addendum)
Subjective: 1 Day Post-Op Procedure(s) (LRB): POSTERIOR LUMBAR FUSION Lumbar 4-Sacral 1,   (N/A) Patient reports pain as moderate. Reports pain back and R hip, especially with transitions. A little numb/tingling in the thigh. No CP, SOB, fever, chills, N/V. Voiding without difficulty.   Objective: Vital signs in last 24 hours: Temp:  [97.3 F (36.3 C)-98.7 F (37.1 C)] 98.7 F (37.1 C) (11/22 0341) Pulse Rate:  [98-117] 98 (11/22 0341) Resp:  [10-27] 20 (11/22 0341) BP: (99-123)/(49-73) 113/67 (11/22 0341) SpO2:  [90 %-100 %] 93 % (11/22 0341)  Intake/Output from previous day: 11/21 0701 - 11/22 0700 In: 1290 [P.O.:240; I.V.:1000; IV Piggyback:50] Out: 5575 [Urine:5275; Blood:300] Intake/Output this shift: No intake/output data recorded.  No results for input(s): HGB in the last 72 hours. No results for input(s): WBC, RBC, HCT, PLT in the last 72 hours. No results for input(s): NA, K, CL, CO2, BUN, CREATININE, GLUCOSE, CALCIUM in the last 72 hours. No results for input(s): LABPT, INR in the last 72 hours.  Neurologically intact ABD soft Neurovascular intact Sensation intact distally Intact pulses distally Dorsiflexion/Plantar flexion intact Incision: dressing C/D/I and no drainage No cellulitis present Compartment soft no sign of DVT  Assessment/Plan: 1 Day Post-Op Procedure(s) (LRB): POSTERIOR LUMBAR FUSION Lumbar 4-Sacral 1,   (N/A) Advance diet Up with therapy D/C IV fluids Pain control Up with PT Plan D/C when able and pain tolerable (later today vs tomorrow depending on progress) Discussed with Dr. Shon BatonBrooks. Okay to d/c if passes PT. Ambulate and TEDs for DVT ppx, per Dr. Shon BatonBrooks start ASA POD7.  Holland, Cory M. 11/07/2018, 7:46 AM

## 2018-11-08 LAB — GLUCOSE, CAPILLARY: GLUCOSE-CAPILLARY: 203 mg/dL — AB (ref 70–99)

## 2018-11-08 NOTE — Progress Notes (Signed)
Physical Therapy Treatment Patient Details Name: Cory Holland MRN: [id number] DOB: 06-10-1966 Today's Date: 11/08/2018    History of Present Illness Pt is a 52 y/o male who is s/p L4-5 lateral interbody fusion on 11/21, and s/p L4-S1 posterior lateral arthrodesis. PMH includes sleep apnea on CPAP, HTN, and DM.     PT Comments    Pt slowly progressing towards all goals. Pt con't to be limited by back pain and R LE pain. Pt cont to require use of RW for safe ambulation. Pt's spouse present and assisted pt on stairs to prep for d/c home today. Acute PT to cont to follow.   Follow Up Recommendations  Home health PT;Supervision - Intermittent     Equipment Recommendations  Rolling walker with 5" wheels    Recommendations for Other Services       Precautions / Restrictions Precautions Precautions: Back Precaution Booklet Issued: Yes (comment) Precaution Comments: Reviewed back precautions with pt. and spouse Required Braces or Orthoses: Spinal Brace Spinal Brace: Lumbar corset;Applied in sitting position Restrictions Weight Bearing Restrictions: No    Mobility  Bed Mobility Overal bed mobility: Needs Assistance Bed Mobility: Rolling;Sidelying to Sit;Sit to Sidelying Rolling: Min guard Sidelying to sit: Min guard     Sit to sidelying: Min assist General bed mobility comments: v/c's for technique, pt able to bring LEs back up onto bed with significant increae in time. pt reports decreased pain with transferring in/out on L side of bed  versus R. wife present for education  Transfers Overall transfer level: Needs assistance Equipment used: Rolling walker (2 wheeled) Transfers: Sit to/from Stand Sit to Stand: From elevated surface;Min assist         General transfer comment: light Min A for lift assist and steadying from elevated surface. Pt demonstrates safe hand placement. Pt slow and guarded with movements. requires cues not to hold breath during transitional  movements and to relax shoulders. very tense during all mobiltiy  Ambulation/Gait Ambulation/Gait assistance: Min guard Gait Distance (Feet): 175 Feet Assistive device: Rolling walker (2 wheeled) Gait Pattern/deviations: Step-through pattern Gait velocity: slow Gait velocity interpretation: <1.31 ft/sec, indicative of household ambulator General Gait Details: slow, guarded, RW adjusted for optimal support. verbal cues to decrease UE support and increase weight through bilat LEs   Stairs Stairs: Yes Stairs assistance: Min assist Stair Management: One rail Left;Step to pattern(1 person HHA on the R) Number of Stairs: 3 General stair comments: verbal cues to contract abdominal muscles to support the back, verbal cues to ascend with L LE and descend with R LE   Wheelchair Mobility    Modified Rankin (Stroke Patients Only)       Balance Overall balance assessment: Needs assistance Sitting-balance support: No upper extremity supported Sitting balance-Leahy Scale: Good     Standing balance support: Bilateral upper extremity supported Standing balance-Leahy Scale: Poor Standing balance comment: Reliant on UE support                            Cognition Arousal/Alertness: Awake/alert Behavior During Therapy: WFL for tasks assessed/performed Overall Cognitive Status: Within Functional Limits for tasks assessed                                 General Comments: .      Exercises      General Comments General comments (skin integrity, edema, etc.): VSS  Pertinent Vitals/Pain Pain Assessment: 0-10 Pain Score: 3  Pain Descriptors / Indicators: Aching;Operative site guarding Pain Intervention(s): Monitored during session    Home Living                      Prior Function            PT Goals (current goals can now be found in the care plan section) Progress towards PT goals: Progressing toward goals    Frequency    Min  5X/week      PT Plan Discharge plan needs to be updated    Co-evaluation              AM-PAC PT "6 Clicks" Mobility   Outcome Measure  Help needed turning from your back to your side while in a flat bed without using bedrails?: A Little Help needed moving from lying on your back to sitting on the side of a flat bed without using bedrails?: A Little Help needed moving to and from a bed to a chair (including a wheelchair)?: A Little Help needed standing up from a chair using your arms (e.g., wheelchair or bedside chair)?: A Little Help needed to walk in hospital room?: A Little Help needed climbing 3-5 steps with a railing? : A Little 6 Click Score: 18    End of Session Equipment Utilized During Treatment: Gait belt;Back brace Activity Tolerance: Patient tolerated treatment well Patient left: in bed;with call bell/phone within reach;with family/visitor present(Justin, PA in room) Nurse Communication: Mobility status PT Visit Diagnosis: Unsteadiness on feet (R26.81);Muscle weakness (generalized) (M62.81);Pain Pain - part of body: (back)     Time: 1610-9604 PT Time Calculation (min) (ACUTE ONLY): 23 min  Charges:  $Gait Training: 8-22 mins $Therapeutic Activity: 8-22 mins                     Lewis Shock, PT, DPT Acute Rehabilitation Services Pager #: [phone] Office #: [phone]    Iona Hansen 11/08/2018, 9:52 AM

## 2018-11-08 NOTE — Progress Notes (Signed)
Discharged instructions/education/AVS/Rx given to patient with wife at bedside and they both verbalized understanding. Pain is mild to moderate and controlled by PRN pain medications. NO drainage, no redness and with mild swelling on incision sites and dressings are dry and intact. Patient ambulating well with walker for stability. Patient voiding well, bowel sounds active. Patient tolerating meals with no issues. Awaiting for transport.

## 2018-11-08 NOTE — Progress Notes (Signed)
CM [facility] to patient with [facility] at [facility], [facility]; [facility]. Abelino DerrickB Dontavious Emily News[facility] LLCRN,MHA,BSN [phone]

## 2018-11-08 NOTE — Progress Notes (Addendum)
Subjective: 2 Days Post-Op Procedure(s) (LRB): POSTERIOR LUMBAR FUSION Lumbar 4-Sacral 1,   (N/A)  Patient reports pain as moderate.  Just finished working with therapy and was able to ascend/descend stairs.  Tolerating POs well.  Admits to BM.  Denies fever, chills, N/V, CP, SOB.  Wife at bedside.  Reports that he is ready to go home.  Objective:   VITALS:  Temp:  [98.4 F (36.9 C)-101.5 F (38.6 C)] 98.4 F (36.9 C) (11/23 0746) Pulse Rate:  [83-121] 105 (11/23 0746) Resp:  [16-20] 18 (11/23 0746) BP: (94-127)/(57-77) 99/67 (11/23 0746) SpO2:  [95 %-99 %] 98 % (11/23 0746)  General: WDWN patient in NAD. Psych:  Appropriate mood and affect. Neuro:  A&O x 3, Moving all extremities, sensation intact to light touch HEENT:  EOMs intact Chest:  Even non-labored respirations Skin: Dressing C/D/I, no rashes or lesions Extremities: warm/dry, mild edema, no erythema or echymosis.  No lymphadenopathy. Pulses: Popliteus 2+ MSK:  ROM: Full ankle ROM bilaterally, MMT: able to perform quad set, (-) Homan's    LABS No results for input(s): HGB, WBC, PLT in the last 72 hours. No results for input(s): NA, K, CL, CO2, BUN, CREATININE, GLUCOSE in the last 72 hours. No results for input(s): LABPT, INR in the last 72 hours.   Assessment/Plan: 2 Days Post-Op Procedure(s) (LRB): POSTERIOR LUMBAR FUSION Lumbar 4-Sacral 1,   (N/A)  Patient is seen in rounds for Dr. Carney CornersBrooks WBAT Rx for rolling walker provided. Plan for outpatient visit with Dr. Shon BatonBrooks D/C home today. Scripts on chart.  Alfredo MartinezJustin Rene Sizelove PA-C [facility] Office:  [phone]

## 2018-11-08 NOTE — Progress Notes (Signed)
Occupational Therapy Treatment Patient Details Name: Cory Holland MRN: [id number] DOB: 1966-05-21 Today's Date: 11/08/2018    History of present illness Pt is a 52 y/o male who is s/p L4-5 lateral interbody fusion on 11/21, and s/p L4-S1 posterior lateral arthrodesis. PMH includes sleep apnea on CPAP, HTN, and DM.    OT comments  Pt. And spouse eager for skilled OT session in preparation for home later today.  Pt. And spouse able to return demo of tub transfer and LB ADLS with min a from wife.  They decline A/E and state she or dtr. Will be available to assist with LB ADLs prn.     Follow Up Recommendations  No OT follow up;Supervision/Assistance - 24 hour    Equipment Recommendations  None recommended by OT    Recommendations for Other Services      Precautions / Restrictions Precautions Precautions: Back Precaution Comments: Reviewed back precautions with pt. and spouse Required Braces or Orthoses: Spinal Brace Spinal Brace: Lumbar corset;Applied in sitting position       Mobility Bed Mobility               General bed mobility comments: seated eob at end of session, reviewed with PT pts and spouses request to practice bed mobility during their session  Transfers Overall transfer level: Needs assistance Equipment used: Rolling walker (2 wheeled) Transfers: Sit to/from [employer] Transfers Sit to Stand: From elevated surface;Min assist         General transfer comment: light Min A for lift assist and steadying from elevated surface. Pt demonstrates safe hand placement. Pt slow and guarded with movements. requires cues not to hold breath during transitional movements and to relax shoulders. very tense during all mobiltiy    Balance                                           ADL either performed or assessed with clinical judgement   ADL Overall ADL's : Needs assistance/impaired                 Upper Body Dressing : Set  up;Sitting Upper Body Dressing Details (indicate cue type and reason): t-shirt and back brace Lower Body Dressing: Maximal assistance;Sit to/from stand Lower Body Dressing Details (indicate cue type and reason): wife placed boxers and pants over feet and pt. was able to pull up each side in standing with min a       Toileting - Clothing Manipulation Details (indicate cue type and reason): pt.s spouse reports she was assisting with BM care prior to sx. and can still assist as needed Tub/ Shower Transfer: Tub transfer;Minimal Engineer, water Details (indicate cue type and reason): pt. and spouse return demo in/out of simulated tub with R faucet. educated on benefits of shower seat or 3n1 Functional mobility during ADLs: Minimal assistance;Rolling walker       Vision       Perception     Praxis      Cognition Arousal/Alertness: Awake/alert Behavior During Therapy: WFL for tasks assessed/performed Overall Cognitive Status: Within Functional Limits for tasks assessed                                          Exercises     Shoulder  Instructions       General Comments      Pertinent Vitals/ Pain       Pain Assessment: 0-10 Pain Score: 5  Pain Descriptors / Indicators: Aching;Operative site guarding Pain Intervention(s): Limited activity within patient's tolerance;Monitored during session;Repositioned  Home Living                                          Prior Functioning/Environment              Frequency  Min 2X/week        Progress Toward Goals  OT Goals(current goals can now be found in the care plan section)  Progress towards OT goals: Progressing toward goals     Plan      Co-evaluation                 AM-PAC OT "6 Clicks" Daily Activity     Outcome Measure                    End of Session Equipment Utilized During Treatment: Gait belt;Rolling walker;Back brace  OT  Visit Diagnosis: Unsteadiness on feet (R26.81);Other abnormalities of gait and mobility (R26.89);Pain   Activity Tolerance Patient tolerated treatment well   Patient Left with call bell/phone within reach;with family/visitor present;Other (comment)(seated eob)   Nurse Communication          Time: 1610-9604: 0720-0738 OT Time Calculation (min): 18 min  Charges: OT General Charges $OT Visit: 1 Visit OT Treatments $Self Care/Home Management : 8-22 mins   Robet LeuMorris, Everado Pillsbury [place], COTA/L 11/08/2018, 7:58 AM

## 2018-11-08 NOTE — Discharge Summary (Signed)
Physician Discharge Summary  Patient ID: Cory Holland MRN: [id number] DOB/AGE: 51-Sep-1967 51 y.o.  Admit date: 11/05/2018 Discharge date: 11/08/2018  Admission Diagnoses: LBP; DDD with compression of spinal cord; hx of DM, GERD, headache, kidney stones, HTN, pneumonia, SOB with exertion, sleep apnea  Discharge Diagnoses:  Active Problems:   Low back pain   Fusion of lumbosacral spine same as above  Discharged Condition: stable  Hospital Course: Patient presented to [facility] on 11/05/18 for elective L4-S1 lumbar fusion by Dr. Shon BatonBrooks.  The patient tolerated the procedure well without complication.  He worked well with therapy.  He tolerated his stay well without difficulty.  He will be D/C'd home on 11/08/18.  Consults: PT/OT  Significant Diagnostic Studies: labs: Hgb after surgery  Treatments: IV hydration, antibiotics: Ancef, analgesia: acetaminophen, Vicodin and Dilaudid, cardiac meds: losartan, insulin: Humalog and surgery: as stated above  Discharge Exam: Blood pressure 99/67, pulse (!) 105, temperature 98.4 F (36.9 C), temperature source Oral, resp. rate 18, height 5\' 8"  (1.727 m), weight 108.1 kg, SpO2 98 %. General: WDWN patient in NAD. Psych:  Appropriate mood and affect. Neuro:  A&O x 3, Moving all extremities, sensation intact to light touch HEENT:  EOMs intact Chest:  Even non-labored respirations Skin:  Dressing C/D/I, no rashes or lesions Extremities: warm/dry, mild edema, no erythema or echymosis.  No lymphadenopathy. Pulses: Popliteus 2+ MSK:  ROM: full ankle ROM, MMT: able to perform quad set, (-) Homan's   Disposition: Discharge disposition: 01-Home or Self Care       Discharge Instructions    Call MD / Call 911   Complete by:  As directed    If you experience chest pain or shortness of breath, CALL 911 and be transported to the hospital emergency room.  If you develope a fever above 101 F, pus (white drainage) or increased drainage or redness  at the wound, or calf pain, call your surgeon's office.   Constipation Prevention   Complete by:  As directed    Drink plenty of fluids.  Prune juice may be helpful.  You may use a stool softener, such as Colace (over the counter) 100 mg twice a day.  Use MiraLax (over the counter) for constipation as needed.   Diet - low sodium heart healthy   Complete by:  As directed    Incentive spirometry RT   Complete by:  As directed    Incentive spirometry RT   Complete by:  As directed    Increase activity slowly as tolerated   Complete by:  As directed      Allergies as of 11/08/2018      Reactions   Benadryl [diphenhydramine Hcl (sleep)] Other (See Comments)   Restless legs      Medication List    STOP taking these medications   naproxen 500 MG tablet Commonly known as:  NAPROSYN   oxyCODONE-acetaminophen 10-325 MG tablet Commonly known as:  PERCOCET     TAKE these medications   anastrozole 1 MG tablet Commonly known as:  ARIMIDEX Take 1 mg by mouth at bedtime.   atorvastatin 40 MG tablet Commonly known as:  LIPITOR Take 40 mg by mouth at bedtime.   ciprofloxacin 500 MG tablet Commonly known as:  CIPRO Take 500 mg by mouth 2 (two) times daily.   docusate sodium 100 MG capsule Commonly known as:  COLACE Take 1 capsule (100 mg total) by mouth 2 (two) times daily.   gabapentin 400 MG capsule Commonly  known as:  NEURONTIN Take 1 capsule (400 mg total) by mouth 3 (three) times daily as needed.   losartan 100 MG tablet Commonly known as:  COZAAR Take 100 mg by mouth at bedtime.   metFORMIN 500 MG tablet Commonly known as:  GLUCOPHAGE Take 500 mg by mouth 2 (two) times daily.   methocarbamol 500 MG tablet Commonly known as:  ROBAXIN Take 1 tablet (500 mg total) by mouth every 6 (six) hours as needed for muscle spasms. What changed:    when to take this  reasons to take this   multivitamin with minerals Tabs tablet Take 1 tablet by mouth daily.   omeprazole 20  MG capsule Commonly known as:  PRILOSEC Take 20 mg by mouth daily as needed (acid reflux).   ondansetron 4 MG disintegrating tablet Commonly known as:  ZOFRAN-ODT Take 1 tablet (4 mg total) by mouth every 8 (eight) hours as needed.   oxyCODONE 5 MG immediate release tablet Commonly known as:  Oxy IR/ROXICODONE Take 1-2 tablets (5-10 mg total) by mouth every 3 (three) hours as needed for moderate pain ((score 4 to 6)).   oxyCODONE 10 mg 12 hr tablet Commonly known as:  OXYCONTIN Take 1 tablet (10 mg total) by mouth every 12 (twelve) hours.   polyethylene glycol packet Commonly known as:  MIRALAX / GLYCOLAX Take 17 g by mouth daily as needed for mild constipation.   ranitidine 150 MG tablet Commonly known as:  ZANTAC Take 150 mg by mouth daily as needed for heartburn.   rOPINIRole 0.5 MG tablet Commonly known as:  REQUIP Take 0.5 mg by mouth at bedtime.   sertraline 100 MG tablet Commonly known as:  ZOLOFT Take 100 mg by mouth at bedtime.   SUMAtriptan 100 MG tablet Commonly known as:  IMITREX Take 100 mg by mouth every 2 (two) hours as needed for migraine or headache. May repeat in 2 hours if headache persists or recurs.   SYSTANE OP Place 1 drop into both eyes daily as needed (dry eyes).   testosterone cypionate 200 MG/ML injection Commonly known as:  DEPOTESTOSTERONE CYPIONATE Inject 100 mg into the muscle every Monday.   Vitamin D 50 MCG (2000 UT) tablet Take 2,000 Units by mouth 2 (two) times daily.            Durable Medical Equipment  (From admission, onward)         Start     Ordered   11/08/18 0820  For home use only DME Walker rolling  Once    Question:  Patient needs a walker to treat with the following condition  Answer:  S/P lumbar fusion   11/08/18 1610         Follow-up Information    Venita Lick, MD Follow up in 2 week(s).   Specialty:  Orthopedic Surgery Contact information: [address] [place]  [address] [phone]           Signed: Lolly Mustache Office:  [phone]
